# Patient Record
Sex: Female | Born: 1975 | Race: Black or African American | Hispanic: No | Marital: Single | Smoking: Former smoker
Health system: Southern US, Community
[De-identification: ages and names within clinical notes are randomized; demographics above are authoritative.]

## PROBLEM LIST (undated history)

## (undated) DIAGNOSIS — S62609A Fracture of unspecified phalanx of unspecified finger, initial encounter for closed fracture: Secondary | ICD-10-CM

## (undated) HISTORY — DX: Fracture of unspecified phalanx of unspecified finger, initial encounter for closed fracture: S62.609A

---

## 2008-12-02 DIAGNOSIS — S62609A Fracture of unspecified phalanx of unspecified finger, initial encounter for closed fracture: Secondary | ICD-10-CM

## 2008-12-02 HISTORY — DX: Fracture of unspecified phalanx of unspecified finger, initial encounter for closed fracture: S62.609A

## 2017-07-06 ENCOUNTER — Encounter: Payer: Self-pay | Admitting: Emergency Medicine

## 2017-07-06 ENCOUNTER — Emergency Department: Payer: Self-pay

## 2017-07-06 ENCOUNTER — Emergency Department
Admission: EM | Admit: 2017-07-06 | Discharge: 2017-07-06 | Disposition: A | Discharge status: Home / Self Care | Payer: Self-pay | Attending: Emergency Medicine | Admitting: Emergency Medicine

## 2017-07-06 DIAGNOSIS — N8000 Endometriosis of the uterus, unspecified: Secondary | ICD-10-CM

## 2017-07-06 DIAGNOSIS — N946 Dysmenorrhea, unspecified: Secondary | ICD-10-CM | POA: Insufficient documentation

## 2017-07-06 LAB — COMPREHENSIVE METABOLIC PANEL
ALBUMIN: 3.7 g/dL (ref 3.5–5.0)
ALT: 11 U/L — ABNORMAL LOW (ref 14–54)
ANION GAP: 5 (ref 5–15)
AST: 16 U/L (ref 15–41)
Alkaline Phosphatase: 50 U/L (ref 38–126)
BILIRUBIN TOTAL: 0.8 mg/dL (ref 0.3–1.2)
BUN: 11 mg/dL (ref 6–20)
CHLORIDE: 111 mmol/L (ref 101–111)
CO2: 24 mmol/L (ref 22–32)
Calcium: 8.9 mg/dL (ref 8.9–10.3)
Creatinine, Ser: 0.89 mg/dL (ref 0.44–1.00)
GFR calc Af Amer: >60 mL/min (ref >60)
GFR calc non Af Amer: >60 mL/min (ref >60)
GLUCOSE: 87 mg/dL (ref 65–99)
POTASSIUM: 4.1 mmol/L (ref 3.5–5.1)
SODIUM: 140 mmol/L (ref 135–145)
TOTAL PROTEIN: 7 g/dL (ref 6.5–8.1)

## 2017-07-06 LAB — URINALYSIS, COMPLETE (UACMP) WITH MICROSCOPIC
BACTERIA UA: NONE SEEN
BILIRUBIN URINE: NEGATIVE
Glucose, UA: NEGATIVE mg/dL
Ketones, ur: NEGATIVE mg/dL
NITRITE: NEGATIVE
PROTEIN: 100 mg/dL — AB
Specific Gravity, Urine: 1.019 (ref 1.005–1.030)
pH: 6 (ref 5.0–8.0)

## 2017-07-06 LAB — WET PREP, GENITAL
Clue Cells Wet Prep HPF POC: NONE SEEN
SPERM: NONE SEEN
Trich, Wet Prep: NONE SEEN
WBC WET PREP: NONE SEEN
Yeast Wet Prep HPF POC: NONE SEEN

## 2017-07-06 LAB — CBC
HEMATOCRIT: 39.7 % (ref 35.0–47.0)
HEMOGLOBIN: 13.1 g/dL (ref 12.0–16.0)
MCH: 28 pg (ref 26.0–34.0)
MCHC: 33 g/dL (ref 32.0–36.0)
MCV: 84.7 fL (ref 80.0–100.0)
Platelets: 317 10*3/uL (ref 150–440)
RBC: 4.69 MIL/uL (ref 3.80–5.20)
RDW: 13 % (ref 11.5–14.5)
WBC: 5.3 10*3/uL (ref 3.6–11.0)

## 2017-07-06 LAB — CHLAMYDIA/NGC RT PCR (ARMC ONLY)
Chlamydia Tr: NOT DETECTED
N gonorrhoeae: NOT DETECTED

## 2017-07-06 LAB — POCT PREGNANCY, URINE: PREG TEST UR: NEGATIVE

## 2017-07-06 MED ORDER — OXYCODONE-ACETAMINOPHEN 5-325 MG PO TABS
1.0000 | ORAL_TABLET | ORAL | Status: DC | PRN
  Administered 2017-07-06: 1 via ORAL
  Filled 2017-07-06: qty 1

## 2017-07-06 MED ORDER — IBUPROFEN 600 MG PO TABS
600.0000 mg | ORAL_TABLET | Freq: Once | ORAL | Status: AC
  Administered 2017-07-06: 600 mg via ORAL
  Filled 2017-07-06: qty 1

## 2017-07-06 MED ORDER — IBUPROFEN 600 MG PO TABS: 600.0000 mg | ORAL_TABLET | Freq: Four times a day (QID) | ORAL | 0 refills | Status: DC | PRN

## 2017-07-06 NOTE — ED Triage Notes (Signed)
Patient presents to the ED with pelvic pain radiating down into her legs x 2 days.  Patient reports the pain began when her period started.  Patient states she has had pain with the last few periods and she states pain is getting worse with each subsequent period.  Patient denies vomiting and diarrhea.  Patient denies tenderness.

## 2017-07-06 NOTE — Discharge Instructions (Signed)
Please follow up closely with obstetrics and gynecology or your primary doctor.  Return to the emergency room if your bleeding worsens, you become weak and dizzy or lightheaded, you have an episode of passing out, develop severe bleeding such as more than 1 soaked pad per hour for more than 3 straight hours, develop abdominal or pelvic pain, fevers chills or other new concerns arise.   

## 2017-07-06 NOTE — ED Notes (Signed)
Pt to front desk c/o pain asking for pain medication. Given dose of percocet per protocol.

## 2017-07-06 NOTE — ED Provider Notes (Signed)
[facility] Emergency Department Provider Note   ____________________________________________   First MD Initiated Contact with Patient 07/06/17 1107     (approximate)  I have reviewed the triage vital signs and the nursing notes.   HISTORY  Chief Complaint Pelvic Pain    HPI Jo Duncan is a 41 y.o. female 4 she is having pain in her lower pelvis, points suprapubically. She's been having this same pain off and on for about the last 2-3 menstrual cycles. She'll have 2-3 days of pain feels like a cramping unremitting discomfort around the time of the start of her menstrual cycles. Seems to be getting worse however. Denies any abnormal odor, she is sexually active with one partner. She denies any pain in the right lower abdomen. No upper abdominal pain. No fevers or chills. No nausea or vomiting.Moving her bowels normally.   History reviewed. No pertinent past medical history.  There are no active problems to display for this patient.   History reviewed. No pertinent surgical history.  Prior to Admission medications   Medication Sig Start Date End Date Taking? Authorizing Provider  ibuprofen (ADVIL,MOTRIN) 600 MG tablet Take 1 tablet (600 mg total) by mouth every 6 (six) hours as needed. 07/06/17   Sharyn Creamer, MD  takes none  Allergies Patient has no known allergies.  No family history on file.  Social History Social History  Substance Use Topics  . Smoking status: Never Smoker  . Smokeless tobacco: Never Used  . Alcohol use Yes     Comment: occasionally    Review of Systems Constitutional: No fever/chills Eyes: No visual changes. ENT: No sore throat. Cardiovascular: Denies chest pain. Respiratory: Denies shortness of breath. Gastrointestinal: No nausea, no vomiting.  No diarrhea.  No constipation. Genitourinary: Negative for dysuria.does note that she sees blood when she urinates because she is on her menstrual  cycle. Musculoskeletal: Negative for back pain. Skin: Negative for rash. Neurological: Negative for headaches, focal weakness or numbness.    ____________________________________________   PHYSICAL EXAM:  VITAL SIGNS: ED Triage Vitals  Enc Vitals Group     BP 07/06/17 0938 137/88     Pulse Rate 07/06/17 0938 90     Resp 07/06/17 0938 18     Temp 07/06/17 0938 98.5 F (36.9 C)     Temp Source 07/06/17 0938 Oral     SpO2 07/06/17 0938 100 %     Weight 07/06/17 0938 140 lb (63.5 kg)     Height 07/06/17 0938 5' (1.524 m)     Head Circumference --      Peak Flow --      Pain Score 07/06/17 0937 10     Pain Loc --      Pain Edu? --      Excl. in GC? --     Constitutional: Alert and oriented. Well appearing and in no acute distress. Eyes: Conjunctivae are normal. Head: Atraumatic. Nose: No congestion/rhinnorhea. Mouth/Throat: Mucous membranes are moist. Neck: No stridor.   Cardiovascular: Normal rate, regular rhythm. Grossly normal heart sounds.  Good peripheral circulation. Respiratory: Normal respiratory effort.  No retractions. Lungs CTAB. Gastrointestinal: Soft and nontenderforearm mild discomfort to palpation suprapubically. No distention.negative Murphy. No pain at McBurney's point. Gynecologic exam performed with the nurse Rosey Bath. External exam is normal. Internal exam demonstrates no cervical motion tenderness. There is mild amount of blood in the vault consistent with menses. No discharge. Musculoskeletal: No lower extremity tenderness nor edema. Neurologic:  Normal speech and language.  No gross focal neurologic deficits are appreciated.  Skin:  Skin is warm, dry and intact. No rash noted. Psychiatric: Mood and affect are normal. Speech and behavior are normal.  ____________________________________________   LABS (all labs ordered are listed, but only abnormal results are displayed)  Labs Reviewed  COMPREHENSIVE METABOLIC PANEL - Abnormal; Notable for the  following:       Result Value   ALT 11 (*)    All other components within normal limits  URINALYSIS, COMPLETE (UACMP) WITH MICROSCOPIC - Abnormal; Notable for the following:    Color, Urine RED (*)    APPearance CLOUDY (*)    Hgb urine dipstick LARGE (*)    Protein, ur 100 (*)    Leukocytes, UA TRACE (*)    Squamous Epithelial / LPF 6-30 (*)    All other components within normal limits  WET PREP, GENITAL  CHLAMYDIA/NGC RT PCR ([facility] ONLY)  CBC  POC URINE PREG, ED  POCT PREGNANCY, URINE   ____________________________________________  EKG   ____________________________________________  RADIOLOGY  [place] Transvaginal Non-ob  Result Date: 07/06/2017 CLINICAL DATA:  Acute onset lower abdominal and pelvic pain for 1 day. LMP 07/05/2017 EXAM: TRANSABDOMINAL AND TRANSVAGINAL ULTRASOUND OF PELVIS DOPPLER ULTRASOUND OF OVARIES TECHNIQUE: Both transabdominal and transvaginal ultrasound examinations of the pelvis were performed. Transabdominal technique was performed for global imaging of the pelvis including uterus, ovaries, adnexal regions, and pelvic cul-de-sac. It was necessary to proceed with endovaginal exam following the transabdominal exam to visualize the endometrium and ovaries. Color and duplex Doppler ultrasound was utilized to evaluate blood flow to the ovaries. COMPARISON:  None. FINDINGS: Uterus Measurements: 7.5 x 4.5 x 5.8 cm. Heterogeneous myometrial echogenicity, however no distinct fibroids identified. Endometrium Thickness: 4 mm.  No focal abnormality visualized. Right ovary Measurements: 2.7 x 1.2 x 2.4 cm. Small corpus luteum noted. Otherwise normal appearance/no adnexal mass. Left ovary Measurements: 2.5 x 1.1 x 1.8 cm. Normal appearance/no adnexal mass. Pulsed Doppler evaluation of both ovaries demonstrates normal low-resistance arterial and venous waveforms. Other findings No abnormal free fluid. IMPRESSION: Heterogeneous myometrial echogenicity, suspicious for uterine  adenomyosis. No fibroids identified. Normal appearance of both ovaries. No sonographic evidence for ovarian torsion. Electronically Signed   By: Myles RosenthalJohn  Stahl M.D.   On: 07/06/2017 12:56   [place] Pelvis Complete  Result Date: 07/06/2017 CLINICAL DATA:  Acute onset lower abdominal and pelvic pain for 1 day. LMP 07/05/2017 EXAM: TRANSABDOMINAL AND TRANSVAGINAL ULTRASOUND OF PELVIS DOPPLER ULTRASOUND OF OVARIES TECHNIQUE: Both transabdominal and transvaginal ultrasound examinations of the pelvis were performed. Transabdominal technique was performed for global imaging of the pelvis including uterus, ovaries, adnexal regions, and pelvic cul-de-sac. It was necessary to proceed with endovaginal exam following the transabdominal exam to visualize the endometrium and ovaries. Color and duplex Doppler ultrasound was utilized to evaluate blood flow to the ovaries. COMPARISON:  None. FINDINGS: Uterus Measurements: 7.5 x 4.5 x 5.8 cm. Heterogeneous myometrial echogenicity, however no distinct fibroids identified. Endometrium Thickness: 4 mm.  No focal abnormality visualized. Right ovary Measurements: 2.7 x 1.2 x 2.4 cm. Small corpus luteum noted. Otherwise normal appearance/no adnexal mass. Left ovary Measurements: 2.5 x 1.1 x 1.8 cm. Normal appearance/no adnexal mass. Pulsed Doppler evaluation of both ovaries demonstrates normal low-resistance arterial and venous waveforms. Other findings No abnormal free fluid. IMPRESSION: Heterogeneous myometrial echogenicity, suspicious for uterine adenomyosis. No fibroids identified. Normal appearance of both ovaries. No sonographic evidence for ovarian torsion. Electronically Signed   By: Myles RosenthalJohn  Stahl M.D.   On:  07/06/2017 12:56   [place] Art/ven Flow Abd Pelv Doppler  Result Date: 07/06/2017 CLINICAL DATA:  Acute onset lower abdominal and pelvic pain for 1 day. LMP 07/05/2017 EXAM: TRANSABDOMINAL AND TRANSVAGINAL ULTRASOUND OF PELVIS DOPPLER ULTRASOUND OF OVARIES TECHNIQUE: Both transabdominal  and transvaginal ultrasound examinations of the pelvis were performed. Transabdominal technique was performed for global imaging of the pelvis including uterus, ovaries, adnexal regions, and pelvic cul-de-sac. It was necessary to proceed with endovaginal exam following the transabdominal exam to visualize the endometrium and ovaries. Color and duplex Doppler ultrasound was utilized to evaluate blood flow to the ovaries. COMPARISON:  None. FINDINGS: Uterus Measurements: 7.5 x 4.5 x 5.8 cm. Heterogeneous myometrial echogenicity, however no distinct fibroids identified. Endometrium Thickness: 4 mm.  No focal abnormality visualized. Right ovary Measurements: 2.7 x 1.2 x 2.4 cm. Small corpus luteum noted. Otherwise normal appearance/no adnexal mass. Left ovary Measurements: 2.5 x 1.1 x 1.8 cm. Normal appearance/no adnexal mass. Pulsed Doppler evaluation of both ovaries demonstrates normal low-resistance arterial and venous waveforms. Other findings No abnormal free fluid. IMPRESSION: Heterogeneous myometrial echogenicity, suspicious for uterine adenomyosis. No fibroids identified. Normal appearance of both ovaries. No sonographic evidence for ovarian torsion. Electronically Signed   By: Myles RosenthalJohn  Stahl M.D.   On: 07/06/2017 12:56    ____________________________________________   PROCEDURES  Procedure(s) performed: None  Procedures  Critical Care performed: No  ____________________________________________   INITIAL IMPRESSION / ASSESSMENT AND PLAN / ED COURSE  Pertinent labs & imaging results that were available during my care of the patient were reviewed by me and considered in my medical decision making (see chart for details).  Lower abdominal pain. Appears to be focused in most prominent at the time of her menses for the last 2-3 months. Does have tenderness suprapubically. No fever, no leukocytosis, no pain in McBurney's point. Reassuring abdominal exam. Suspect likely gynecologic etiology of her  pain. Reassuring examination, we'll obtain a transvaginal ultrasound. Do not see any indication of an acute abdomen or need to warrant CT imagin     ----------------------------------------- 1:05 PM on 07/06/2017 -----------------------------------------  Patient reports improvement. Resting comfortably. Decreased and only minimal discomfort suprapubically. No pain in the remaining abdomen. Reviewed ultrasound results and recommended follow-up with gynecology. Patient agreeable. Return precautions and treatment recommendations and follow-up discussed with the patient who is agreeable with the plan.  ____________________________________________   FINAL CLINICAL IMPRESSION(S) / ED DIAGNOSES  Final diagnoses:  Painful menstruation  Uterus, adenomyosis      NEW MEDICATIONS STARTED DURING THIS VISIT:  New Prescriptions   IBUPROFEN (ADVIL,MOTRIN) 600 MG TABLET    Take 1 tablet (600 mg total) by mouth every 6 (six) hours as needed.     Note:  This document was prepared using Dragon voice recognition software and may include unintentional dictation errors.     Sharyn CreamerQuale, Darrelle Barrell, MD 07/06/17 [phone]

## 2017-07-11 ENCOUNTER — Encounter: Payer: Self-pay | Admitting: Obstetrics & Gynecology

## 2017-07-11 ENCOUNTER — Ambulatory Visit (INDEPENDENT_AMBULATORY_CARE_PROVIDER_SITE_OTHER): Payer: Self-pay | Admitting: Obstetrics & Gynecology

## 2017-07-11 VITALS — BP 118/76 | Ht 60.0 in | Wt 140.0 lb

## 2017-07-11 DIAGNOSIS — N946 Dysmenorrhea, unspecified: Secondary | ICD-10-CM

## 2017-07-11 DIAGNOSIS — Z87410 Personal history of cervical dysplasia: Secondary | ICD-10-CM | POA: Insufficient documentation

## 2017-07-11 NOTE — Progress Notes (Signed)
Dysmenorrhea/ Premenstrual Syndrome Patient complains of menstrual symptoms. Symptoms began several months ago. Patient describes symptoms of pelvic pain (moderate). Symptoms occur with periods, which are usually 3-5 days apart and quite regular. Patient denies breast tenderness, depression, labile mood and menorrhagia. Evaluation to date includes: pelvic US (Adenomyosis, no fibroids). Treatment to date includes: nothing. The patient is sexually active.  Prior Cryo to cervix for dysplasia. Periods are light but very painful.   PMHx: She  has no past medical history on file. Also,  has no past surgical history on file., family history includes Colon cancer in her maternal grandfather; Diabetes in her father and mother; Hypertension in her father and mother.,  reports that she has never smoked. She has never used smokeless tobacco. She reports that she drinks alcohol. She reports that she uses drugs, including Marijuana.  She has a current medication list which includes the following prescription(s): ibuprofen. Also, is allergic to codeine.  Review of Systems  Constitutional: Negative for chills, fever and malaise/fatigue.  HENT: Negative for congestion, sinus pain and sore throat.   Eyes: Negative for blurred vision and pain.  Respiratory: Negative for cough and wheezing.   Cardiovascular: Negative for chest pain and leg swelling.  Gastrointestinal: Negative for abdominal pain, constipation, diarrhea, heartburn, nausea and vomiting.  Genitourinary: Negative for dysuria, frequency, hematuria and urgency.  Musculoskeletal: Negative for back pain, joint pain, myalgias and neck pain.  Skin: Negative for itching and rash.  Neurological: Negative for dizziness, tremors and weakness.  Endo/Heme/Allergies: Does not bruise/bleed easily.  Psychiatric/Behavioral: Negative for depression. The patient is not nervous/anxious and does not have insomnia.     Objective: BP 118/76   Ht 5' (1.524 m)   Wt  140 lb (63.5 kg)   LMP 07/04/2017 (Approximate)   BMI 27.34 kg/m  Physical Exam  Constitutional: She is oriented to person, place, and time. She appears well-developed and well-nourished. No distress.  Genitourinary: Rectum normal, vagina normal and uterus normal. Pelvic exam was performed with patient supine. There is no rash or lesion on the right labia. There is no rash or lesion on the left labia. Vagina exhibits no lesion. No bleeding in the vagina. Right adnexum does not display mass and does not display tenderness. Left adnexum does not display mass and does not display tenderness. Cervix does not exhibit motion tenderness, lesion, friability or polyp.   Uterus is mobile and midaxial. Uterus is not enlarged or exhibiting a mass.  HENT:  Head: Normocephalic and atraumatic. Head is without laceration.  Right Ear: Hearing normal.  Left Ear: Hearing normal.  Nose: No epistaxis.  No foreign bodies.  Mouth/Throat: Uvula is midline, oropharynx is clear and moist and mucous membranes are normal.  Eyes: Pupils are equal, round, and reactive to light.  Neck: Normal range of motion. Neck supple. No thyromegaly present.  Cardiovascular: Normal rate and regular rhythm.  Exam reveals no gallop and no friction rub.   No murmur heard. Pulmonary/Chest: Effort normal and breath sounds normal. No respiratory distress. She has no wheezes. Right breast exhibits no mass, no skin change and no tenderness. Left breast exhibits no mass, no skin change and no tenderness.  Abdominal: Soft. Bowel sounds are normal. She exhibits no distension. There is no tenderness. There is no rebound.  Musculoskeletal: Normal range of motion.  Neurological: She is alert and oriented to person, place, and time. No cranial nerve deficit.  Skin: Skin is warm and dry.  Psychiatric: She has a normal mood and affect.  Judgment normal.  Vitals reviewed.   Results for orders placed or performed during the hospital encounter of  07/06/17  Chlamydia/NGC rt PCR  Result Value Ref Range   Specimen source GC/Chlam ENDOCERVICAL    Chlamydia Tr NOT DETECTED NOT DETECTED   N gonorrhoeae NOT DETECTED NOT DETECTED  Wet prep, genital  Result Value Ref Range   Yeast Wet Prep HPF POC NONE SEEN NONE SEEN   Trich, Wet Prep NONE SEEN NONE SEEN   Clue Cells Wet Prep HPF POC NONE SEEN NONE SEEN   WBC, Wet Prep HPF POC NONE SEEN NONE SEEN   Sperm NONE SEEN   Comprehensive metabolic panel  Result Value Ref Range   Sodium 140 135 - 145 mmol/L   Potassium 4.1 3.5 - 5.1 mmol/L   Chloride 111 101 - 111 mmol/L   CO2 24 22 - 32 mmol/L   Glucose, Bld 87 65 - 99 mg/dL   BUN 11 6 - 20 mg/dL   Creatinine, Ser 1.61 0.44 - 1.00 mg/dL   Calcium 8.9 8.9 - 09.6 mg/dL   Total Protein 7.0 6.5 - 8.1 g/dL   Albumin 3.7 3.5 - 5.0 g/dL   AST 16 15 - 41 U/L   ALT 11 (L) 14 - 54 U/L   Alkaline Phosphatase 50 38 - 126 U/L   Total Bilirubin 0.8 0.3 - 1.2 mg/dL   GFR calc non Af Amer >60 >60 mL/min   GFR calc Af Amer >60 >60 mL/min   Anion gap 5 5 - 15  CBC  Result Value Ref Range   WBC 5.3 3.6 - 11.0 K/uL   RBC 4.69 3.80 - 5.20 MIL/uL   Hemoglobin 13.1 12.0 - 16.0 g/dL   HCT 04.5 40.9 - 81.1 %   MCV 84.7 80.0 - 100.0 fL   MCH 28.0 26.0 - 34.0 pg   MCHC 33.0 32.0 - 36.0 g/dL   RDW 91.4 78.2 - 95.6 %   Platelets 317 150 - 440 K/uL  Urinalysis, Complete w Microscopic  Result Value Ref Range   Color, Urine RED (A) YELLOW   APPearance CLOUDY (A) CLEAR   Specific Gravity, Urine 1.019 1.005 - 1.030   pH 6.0 5.0 - 8.0   Glucose, UA NEGATIVE NEGATIVE mg/dL   Hgb urine dipstick LARGE (A) NEGATIVE   Bilirubin Urine NEGATIVE NEGATIVE   Ketones, ur NEGATIVE NEGATIVE mg/dL   Protein, ur 213 (A) NEGATIVE mg/dL   Nitrite NEGATIVE NEGATIVE   Leukocytes, UA TRACE (A) NEGATIVE   RBC / HPF TOO NUMEROUS TO COUNT 0 - 5 RBC/hpf   WBC, UA 6-30 0 - 5 WBC/hpf   Bacteria, UA NONE SEEN NONE SEEN   Squamous Epithelial / LPF 6-30 (A) NONE SEEN   Mucous  PRESENT   Pregnancy, urine POC  Result Value Ref Range   Preg Test, Ur NEGATIVE NEGATIVE   EXAM: TRANSABDOMINAL AND TRANSVAGINAL ULTRASOUND OF PELVIS  DOPPLER ULTRASOUND OF OVARIES  TECHNIQUE: Both transabdominal and transvaginal ultrasound examinations of the pelvis were performed. Transabdominal technique was performed for global imaging of the pelvis including uterus, ovaries, adnexal regions, and pelvic cul-de-sac.  It was necessary to proceed with endovaginal exam following the transabdominal exam to visualize the endometrium and ovaries. Color and duplex Doppler ultrasound was utilized to evaluate blood flow to the ovaries.  COMPARISON:  None.  FINDINGS: Uterus  Measurements: 7.5 x 4.5 x 5.8 cm. Heterogeneous myometrial echogenicity, however no distinct fibroids identified.  Endometrium  Thickness: 4 mm.  No focal abnormality visualized.  Right ovary  Measurements: 2.7 x 1.2 x 2.4 cm. Small corpus luteum noted. Otherwise normal appearance/no adnexal mass.  Left ovary  Measurements: 2.5 x 1.1 x 1.8 cm. Normal appearance/no adnexal mass.  Pulsed Doppler evaluation of both ovaries demonstrates normal low-resistance arterial and venous waveforms.  Other findings  No abnormal free fluid.  IMPRESSION: Heterogeneous myometrial echogenicity, suspicious for uterine adenomyosis. No fibroids identified.  Normal appearance of both ovaries.  No sonographic evidence for ovarian torsion.  Review of ULTRASOUND.    I have personally reviewed images and report [facility]    Plan of management to be discussed with patient.  ASSESSMENT/PLAN:    Problem List Items Addressed This Visit      Genitourinary   Dysmenorrhea     Other   History of cervical dysplasia - Primary    PAP needed and done. Options for dysmenorrhea and possible hematocolpos/obstructive uterus and adenomyosis discussed (D&C, hysterectomy).   Consideration for future, cont NSAIDs and exp mgt for now.  Annamarie MajorPaul Tiler Brandis, MD, Merlinda FrederickFACOG [facility], [facility] 07/11/2017  1:42 PM

## 2017-07-14 LAB — PAP IG (IMAGE GUIDED): PAP Smear Comment: 0

## 2017-12-24 ENCOUNTER — Encounter: Payer: Self-pay | Admitting: Physician Assistant

## 2017-12-24 ENCOUNTER — Ambulatory Visit (INDEPENDENT_AMBULATORY_CARE_PROVIDER_SITE_OTHER): Payer: No Typology Code available for payment source | Admitting: Physician Assistant

## 2017-12-24 VITALS — BP 122/68 | HR 68 | Temp 98.4°F | Resp 16 | Ht 60.0 in | Wt 144.0 lb

## 2017-12-24 DIAGNOSIS — Z114 Encounter for screening for human immunodeficiency virus [HIV]: Secondary | ICD-10-CM | POA: Diagnosis not present

## 2017-12-24 DIAGNOSIS — Z0000 Encounter for general adult medical examination without abnormal findings: Secondary | ICD-10-CM

## 2017-12-24 DIAGNOSIS — B373 Candidiasis of vulva and vagina: Secondary | ICD-10-CM

## 2017-12-24 DIAGNOSIS — N898 Other specified noninflammatory disorders of vagina: Secondary | ICD-10-CM | POA: Diagnosis not present

## 2017-12-24 DIAGNOSIS — Z130 Encounter for screening for diseases of the blood and blood-forming organs and certain disorders involving the immune mechanism: Secondary | ICD-10-CM | POA: Diagnosis not present

## 2017-12-24 DIAGNOSIS — Z131 Encounter for screening for diabetes mellitus: Secondary | ICD-10-CM | POA: Diagnosis not present

## 2017-12-24 DIAGNOSIS — Z23 Encounter for immunization: Secondary | ICD-10-CM

## 2017-12-24 DIAGNOSIS — Z13220 Encounter for screening for lipoid disorders: Secondary | ICD-10-CM

## 2017-12-24 DIAGNOSIS — Z1239 Encounter for other screening for malignant neoplasm of breast: Secondary | ICD-10-CM

## 2017-12-24 DIAGNOSIS — Z1231 Encounter for screening mammogram for malignant neoplasm of breast: Secondary | ICD-10-CM | POA: Diagnosis not present

## 2017-12-24 DIAGNOSIS — Z113 Encounter for screening for infections with a predominantly sexual mode of transmission: Secondary | ICD-10-CM

## 2017-12-24 DIAGNOSIS — Z1159 Encounter for screening for other viral diseases: Secondary | ICD-10-CM

## 2017-12-24 DIAGNOSIS — Z1329 Encounter for screening for other suspected endocrine disorder: Secondary | ICD-10-CM

## 2017-12-24 DIAGNOSIS — B3731 Acute candidiasis of vulva and vagina: Secondary | ICD-10-CM

## 2017-12-24 NOTE — Progress Notes (Signed)
Patient: Jo Duncan Female    DOB: 12/20/1975   42 y.o.   MRN: [id number] Visit Date: 12/24/2017  Today's Provider: Trey SailorsAdriana M Pollak, PA-C   Chief Complaint  Patient presents with  . Establish Care   Subjective:    Jo Duncan is a 42 y/o woman presenting today to establish care. She lives in [place]. 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 Two children ages 6327, [id number]; three grandchildren. Female partner for three and half years. Sexually active with her partner.   She reports she wants to be screened for STD's including HIV, syphilis, and Hepatitis C.  Smoked for 3 years in the 90's, 1/2 pack per day Alcohol: mixed drink once per day Drugs: marijuana   Tetanus shot: needed Influenza: declined  She has a history of painful menstrual cycles. She is a patient of Dr. Tiburcio PeaHarris at [facility]. 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 She wants to proceed with hysterectomy,   She also reports that she has been having vaginal discharge. She reports having a history of BV. Would like to be tested for this today.   Vaginal Discharge  The patient's primary symptoms include vaginal discharge. This is a recurrent problem. The problem has been gradually worsening.       Allergies  Allergen Reactions  . Codeine Other (See Comments)     Current Outpatient Medications:  .  ibuprofen (ADVIL,MOTRIN) 600 MG tablet, Take 1 tablet (600 mg total) by mouth every 6 (six) hours as needed., Disp: 30 tablet, Rfl: 0  Review of Systems  Genitourinary: Positive for vaginal discharge.    Social History   Tobacco Use  . Smoking status: Former Smoker    Types: Cigarettes    Last attempt to quit: 05/02/1996    Years since quitting: 21.6  . Smokeless tobacco: Never Used  Substance Use Topics  . Alcohol use: Yes    Comment: occasionally   Objective:   BP 122/68 (BP Location: Right Arm, Patient Position: Sitting, Cuff Size: Large)   Pulse 68   Temp 98.4 F (36.9 C) (Oral)   Resp 16    Ht 5' (1.524 m)   Wt 144 lb (65.3 kg)   LMP 12/15/2017   BMI 28.12 kg/m  Vitals:   12/24/17 1417  BP: 122/68  Pulse: 68  Resp: 16  Temp: 98.4 F (36.9 C)  TempSrc: Oral  Weight: 144 lb (65.3 kg)  Height: 5' (1.524 m)     Physical Exam  Constitutional: She is oriented to person, place, and time. She appears well-developed and well-nourished.  HENT:  Right Ear: External ear normal.  Left Ear: External ear normal.  Mouth/Throat: Oropharynx is clear and moist. No oropharyngeal exudate.  Eyes: Conjunctivae are normal.  Neck: Neck supple.  Cardiovascular: Normal rate and regular rhythm.  Pulmonary/Chest: Effort normal and breath sounds normal. Right breast exhibits no inverted nipple, no mass, no nipple discharge, no skin change and no tenderness. Left breast exhibits no inverted nipple, no mass, no nipple discharge, no skin change and no tenderness. Breasts are symmetrical.  Abdominal: Soft. Bowel sounds are normal.  Genitourinary: There is no rash, tenderness, lesion or injury on the right labia. There is no rash, tenderness, lesion or injury on the left labia. Cervix exhibits no discharge. No erythema or tenderness in the vagina. No foreign body in the vagina. No signs of injury around the vagina. Vaginal discharge found.  Lymphadenopathy:    She has no cervical adenopathy.  Neurological: She  is alert and oriented to person, place, and time.  Skin: Skin is warm and dry.  Psychiatric: She has a normal mood and affect. Her behavior is normal.        Assessment & Plan:     1. Annual physical exam   2. Screening for STDs (sexually transmitted diseases)  - HIV antibody (with reflex) - Hepatitis c antibody (reflex) - RPR  3. Breast cancer screening  - MM Digital Screening; Future  4. Encounter for screening for HIV   5. Encounter for hepatitis C screening test for low risk patient   6. Diabetes mellitus screening  - Comprehensive Metabolic Panel (CMET)  7.  Screening cholesterol level  - Lipid Profile  8. Screening for thyroid disorder - TSH  9. Screening for deficiency anemia  - CBC with Differential  10. Vaginal discharge  - NuSwab Vaginitis Plus (VG+)  11. Need for Tdap vaccination  - Tdap vaccine greater than or equal to 7yo IM  Return in about 1 year (around 12/24/2018), or if symptoms worsen or fail to improve, for CPE.  The entirety of the information documented in the History of Present Illness, Review of Systems and Physical Exam were personally obtained by me. Portions of this information were initially documented by Kavin Leech, CMA and reviewed by me for thoroughness and accuracy.        Trey Sailors, PA-C  [facility]

## 2017-12-27 LAB — NUSWAB VAGINITIS PLUS (VG+)
BVAB 2: HIGH Score — AB
Candida albicans, NAA: POSITIVE — AB
Candida glabrata, NAA: NEGATIVE
Chlamydia trachomatis, NAA: NEGATIVE
Neisseria gonorrhoeae, NAA: NEGATIVE
Trich vag by NAA: NEGATIVE

## 2017-12-29 ENCOUNTER — Telehealth: Payer: Self-pay

## 2017-12-29 MED ORDER — FLUCONAZOLE 150 MG PO TABS: ORAL_TABLET | ORAL | 0 refills | Status: DC

## 2017-12-29 NOTE — Telephone Encounter (Signed)
-----   Message from Trey SailorsAdriana M Pollak, [place]PA-C sent at 12/26/2017  1:40 PM EST ----- Nuswab came back positive for Yeast and "indeterminate" for BV. This means she definitely have yeast, and may or may not have BV depending on her symptoms. Would suggest treating the yeast first and seeing if she feels better, if not, can treat for BV as well. I can send in diflucan two pills or terconazole vaginal cream, let me know which one please.

## 2017-12-29 NOTE — Telephone Encounter (Signed)
Pt advised.   She would like Diflucan sent to [facility] [address]   [address],   -Vernona RiegerLaura

## 2017-12-29 NOTE — Telephone Encounter (Signed)
LMTCB 12/29/2017  Thanks,   -Lalanya Rufener  

## 2017-12-29 NOTE — Telephone Encounter (Signed)
Diflucan 150 mg tablets #2 sent to [facility] [address].

## 2017-12-29 NOTE — Addendum Note (Signed)
Addended by: Trey SailorsPOLLAK, ADRIANA M on: 12/29/2017 03:14 PM   Modules accepted: Orders

## 2018-01-26 ENCOUNTER — Telehealth: Payer: Self-pay

## 2018-01-26 ENCOUNTER — Other Ambulatory Visit: Payer: Self-pay | Admitting: Physician Assistant

## 2018-01-26 DIAGNOSIS — B373 Candidiasis of vulva and vagina: Secondary | ICD-10-CM

## 2018-01-26 DIAGNOSIS — B3731 Acute candidiasis of vulva and vagina: Secondary | ICD-10-CM

## 2018-01-26 NOTE — Telephone Encounter (Signed)
Pt was seen on 12/24/2017 for a Yeast infection.  She reports she took one Diflucan and is still having symptoms.  She would like a refill [facility] to [facility] [address]  She states she only received one pill 12/24/2017.  Thanks,   -Vernona RiegerLaura

## 2019-04-29 ENCOUNTER — Other Ambulatory Visit: Payer: Self-pay | Admitting: Physician Assistant

## 2019-04-29 DIAGNOSIS — B373 Candidiasis of vulva and vagina: Secondary | ICD-10-CM

## 2019-04-29 DIAGNOSIS — B3731 Acute candidiasis of vulva and vagina: Secondary | ICD-10-CM

## 2019-04-29 MED ORDER — FLUCONAZOLE 150 MG PO TABS: ORAL_TABLET | ORAL | 0 refills | Status: DC

## 2019-04-29 NOTE — Telephone Encounter (Signed)
I will send it in once. If she still has issues she will need a follow up office visit.

## 2019-04-29 NOTE — Telephone Encounter (Signed)
Pt says she does not have any insurance to have a visit  CB#  [phone]  Thanks teri

## 2019-04-29 NOTE — Telephone Encounter (Signed)
Schedule e-visit/telephone visit.

## 2019-04-29 NOTE — Telephone Encounter (Signed)
Patient requesting a refill on the Diflucan, reports that she is having another yeast infection.

## 2019-04-29 NOTE — Telephone Encounter (Signed)
Please advise. KW 

## 2019-06-03 ENCOUNTER — Encounter: Payer: Self-pay | Admitting: Emergency Medicine

## 2019-06-03 ENCOUNTER — Emergency Department
Admission: EM | Admit: 2019-06-03 | Discharge: 2019-06-03 | Disposition: A | Discharge status: Home / Self Care | Payer: Self-pay | Attending: Emergency Medicine | Admitting: Emergency Medicine

## 2019-06-03 ENCOUNTER — Other Ambulatory Visit: Payer: Self-pay

## 2019-06-03 ENCOUNTER — Emergency Department: Payer: Self-pay

## 2019-06-03 DIAGNOSIS — R0789 Other chest pain: Secondary | ICD-10-CM | POA: Insufficient documentation

## 2019-06-03 DIAGNOSIS — Z87891 Personal history of nicotine dependence: Secondary | ICD-10-CM | POA: Insufficient documentation

## 2019-06-03 DIAGNOSIS — K5900 Constipation, unspecified: Secondary | ICD-10-CM | POA: Insufficient documentation

## 2019-06-03 MED ORDER — CYCLOBENZAPRINE HCL 10 MG PO TABS: 10.0000 mg | ORAL_TABLET | Freq: Three times a day (TID) | ORAL | 0 refills | Status: DC | PRN

## 2019-06-03 MED ORDER — NAPROXEN 500 MG PO TABS: 500.0000 mg | ORAL_TABLET | Freq: Two times a day (BID) | ORAL | 0 refills | Status: DC

## 2019-06-03 MED ORDER — NAPROXEN 500 MG PO TABS
500.0000 mg | ORAL_TABLET | Freq: Once | ORAL | Status: AC
  Administered 2019-06-03: 500 mg via ORAL
  Filled 2019-06-03: qty 1

## 2019-06-03 NOTE — ED Provider Notes (Signed)
[facility] Emergency Department Provider Note  ____________________________________________  Time seen: Approximately 9:16 AM  I have reviewed the triage vital signs and the nursing notes.   HISTORY  Chief Complaint Chest Pain and Shortness of Breath   HPI Jo Duncan is a 43 y.o. female who presents to the emergency department for treatment and evaluation of right lower rib pain. Symptoms started yesterday. Area hurts worse with deep breath. Three days ago she had a sore throat and had a COVID-19 test, which is still pending. She now denies sore throat. She also denies cough, shortness of breath, fever, nausea, vomiting, or diarrhea. She also states that she is constipated. No alleviating measures attempted.   Past Medical History:  Diagnosis Date  . Finger fracture, right 2010    Patient Active Problem List   Diagnosis Date Noted  . History of cervical dysplasia 07/11/2017  . Dysmenorrhea 07/11/2017    History reviewed. No pertinent surgical history.  Prior to Admission medications   Medication Sig Start Date End Date Taking? Authorizing Provider  cyclobenzaprine (FLEXERIL) 10 MG tablet Take 1 tablet (10 mg total) by mouth 3 (three) times daily as needed. 06/03/19   Brittish Bolinger, Johnette Abraham B, FNP  naproxen (NAPROSYN) 500 MG tablet Take 1 tablet (500 mg total) by mouth 2 (two) times daily with a meal. 06/03/19   Giovanie Lefebre B, FNP    Allergies Codeine  Family History  Problem Relation Age of Onset  . Diabetes Mother   . Hypertension Mother   . Diabetes Father   . Hypertension Father   . Colon cancer Maternal Grandfather   . Diabetes Maternal Grandmother   . Hypertension Maternal Grandmother   . Cancer Neg Hx   . Ovarian cancer Neg Hx     Social History Social History   Tobacco Use  . Smoking status: Former Smoker    Types: Cigarettes    Quit date: 05/02/1996    Years since quitting: 23.1  . Smokeless tobacco: Never Used  Substance Use  Topics  . Alcohol use: Yes    Comment: occasionally  . Drug use: Yes    Types: Marijuana    Review of Systems Constitutional: Negative for fever/chills. Normal appetite. ENT: No sore throat. Cardiovascular: Denies chest pain. Respiratory: No shortness of breath. No for cough. No wheezing.  Gastrointestinal: No nausea,  no vomiting.  no diarrhea.  Musculoskeletal: Negative for body aches. Positive for right lateral chest wall pain. Skin: Negative for rash. Neurological: Negative for headaches ____________________________________________   PHYSICAL EXAM:  VITAL SIGNS: ED Triage Vitals  Enc Vitals Group     BP 06/03/19 0742 129/78     Pulse Rate 06/03/19 0742 73     Resp --      Temp 06/03/19 0742 98.4 F (36.9 C)     Temp Source 06/03/19 0742 Oral     SpO2 06/03/19 0742 98 %     Weight 06/03/19 0742 143 lb (64.9 kg)     Height 06/03/19 0742 5' (1.524 m)     Head Circumference --      Peak Flow --      Pain Score 06/03/19 0736 8     Pain Loc --      Pain Edu? --      Excl. in [place]? --     Constitutional: Alert and oriented. Well appearing and in no acute distress. Eyes: Conjunctivae are normal. Ears: exam deferred. Nose: no sinus congestion noted; no rhinnorhea. Mouth/Throat: Mucous membranes are  moist.  Oropharynx without erythema. Tonsils not visualized. Uvula midline. Neck: No stridor.  Lymphatic: no cervical lymphadenopathy. Cardiovascular: Normal rate, regular rhythm. Good peripheral circulation. Respiratory: Respirations are even and unlabored.  No retractions. Breath sounds are clear. Reproducible right lateral rib pain with deep breath, palpation, and movement. Gastrointestinal: Soft and nontender. Bowel sounds present.  Musculoskeletal: FROM x 4 extremities.  Neurologic:  Normal speech and language. Skin:  Skin is warm, dry and intact. No rash noted. Psychiatric: Mood and affect are normal. Speech and behavior are  normal.  ____________________________________________   LABS (all labs ordered are listed, but only abnormal results are displayed)  Labs Reviewed - No data to display ____________________________________________  EKG  Not indicated. ____________________________________________  RADIOLOGY  Chest x-ray shows no acute cardiopulmonary process. ____________________________________________   PROCEDURES  Procedure(s) performed: None  Critical Care performed: No ____________________________________________   INITIAL IMPRESSION / ASSESSMENT AND PLAN / ED COURSE  43 y.o. female who presents to the emergency department for evaluation of right lateral thoracic pain that started yesterday. She denies shortness of breath. She has not taken anything to help relieve this.   Chest x-ray and exam are reassuring. PERC negative. No lower extremity pain or swelling. Low suspicion for COVID-19 and test from 2 days ago is still pending. Vital signs are normal--no tachycardia, fever, or hypoxia.   Patient will be treated with magnesium citrate for reported constipation, naprosyn, and flexeril. She is to follow up with primary care or return to the ER for symptoms of concern.    Medications  naproxen (NAPROSYN) tablet 500 mg (has no administration in time range)    ED Discharge Orders         Ordered    naproxen (NAPROSYN) 500 MG tablet  2 times daily with meals     06/03/19 0932    cyclobenzaprine (FLEXERIL) 10 MG tablet  3 times daily PRN     06/03/19 0932           Jo Duncan was evaluated in Emergency Department on 06/03/2019 for the symptoms described in the history of present illness. She was evaluated in the context of the global COVID-19 pandemic, which necessitated consideration that the patient might be at risk for infection with the SARS-CoV-2 virus that causes COVID-19. Institutional protocols and algorithms that pertain to the evaluation of patients at risk for  COVID-19 are in a state of rapid change based on information released by regulatory bodies including the CDC and federal and state organizations. These policies and algorithms were followed during the patient's care in the ED.   Pertinent labs & imaging results that were available during my care of the patient were reviewed by me and considered in my medical decision making (see chart for details).    If controlled substance prescribed during this visit, 12 month history viewed on the [facility] prior to issuing an initial prescription for Schedule II or III opiod. ____________________________________________   FINAL CLINICAL IMPRESSION(S) / ED DIAGNOSES  Final diagnoses:  Acute chest wall pain  Constipation, unspecified constipation type    Note:  This document was prepared using Dragon voice recognition software and may include unintentional dictation errors.     Chinita Pesterriplett, Beau Ramsburg B, FNP 06/03/19 [id number]    Jeanmarie PlantMcShane, James A, MD 06/03/19 [id number]

## 2019-06-03 NOTE — Discharge Instructions (Addendum)
Please follow up with your primary care provider if not improving over the next few days.  Rest and apply ice to the area 20 minutes per hour while awake.  Take Magnesium Citrate--you can purchase this at the pharmacy--to help with constipation.  Return to the ER for symptoms that change or worsen if unable to schedule an appointment.

## 2019-06-03 NOTE — ED Triage Notes (Signed)
Says she had had a cough.  Now since yesterday had pain right lower rib area that increases with deep breath.  Tested for covid couple days ago.

## 2019-06-03 NOTE — ED Notes (Addendum)
See triage note states she developed pain to right lateral rib area   States pain increases with inspiration  States she had a sore throat on Monday and went to urgent care  They did a COVID swab but she doesn't have the results  Afebrile on arrival

## 2019-10-04 ENCOUNTER — Other Ambulatory Visit: Payer: Self-pay

## 2019-10-06 ENCOUNTER — Ambulatory Visit: Payer: Self-pay | Attending: Oncology

## 2019-10-06 ENCOUNTER — Ambulatory Visit
Admission: RE | Admit: 2019-10-06 | Discharge: 2019-10-06 | Disposition: A | Discharge status: Home / Self Care | Payer: Self-pay | Source: Ambulatory Visit | Attending: Oncology | Admitting: Oncology

## 2019-10-06 ENCOUNTER — Other Ambulatory Visit: Payer: Self-pay

## 2019-10-06 VITALS — BP 119/79 | HR 78 | Temp 97.2°F | Resp 16 | Ht 64.0 in | Wt 148.1 lb

## 2019-10-06 DIAGNOSIS — Z0000 Encounter for general adult medical examination without abnormal findings: Secondary | ICD-10-CM

## 2019-10-06 NOTE — Progress Notes (Signed)
  Subjective:     Patient ID: Stanton Kidney, female   DOB: 06-11-1976, 43 y.o.   MRN: [id number]  HPI   Review of Systems     Objective:   Physical Exam Chest:     Breasts:        Right: No swelling, bleeding, inverted nipple, mass, nipple discharge, skin change or tenderness.        Left: No swelling, bleeding, inverted nipple, mass, nipple discharge, skin change or tenderness.        Assessment:     43 year old patient presents for [facility] clinic visit.  Patient recently moved [place] to [place] to son, and grandchildren. Patient screened, and meets BCCCP eligibility.  Patient does not have insurance, Medicare or Medicaid. Instructed patient on breast self awareness using teach back method. Clinical breast exam unremarkable.  No mass or lump palpated.   Risk Assessment    No risk assessment data for the current encounter   Risk Scores      10/04/2019   Last edited by: Orson Slick, CMA   5-year risk: 0.9 %   Lifetime risk: 9.3 %            Plan:     Sent for bilateral screening mammogram.

## 2019-11-08 ENCOUNTER — Encounter: Payer: Self-pay | Admitting: *Deleted

## 2019-12-10 NOTE — Progress Notes (Signed)
Letter mailed from [facility] to notify of normal mammogram results.  Patient to return in one year for annual screening.  Copy to [facility]. 

## 2021-01-02 NOTE — Progress Notes (Signed)
Due to Covid 19 pandemic a Televisit was used to enroll patient into our BCCCP program.  2 patient identifiers used to confirm I was speaking to the correct patient.   Verbal consent obtained.  Health history obtained.  Patient denies any breast problems.  States she just had a pap smear 2 weeks ago at [facility].  She is to report to the [facility] on 01/04/21 @ 8:30 for her mammogram.  Will follow up per BCCCP protocol.

## 2021-01-03 ENCOUNTER — Other Ambulatory Visit: Payer: Self-pay

## 2021-01-03 ENCOUNTER — Ambulatory Visit: Payer: Self-pay | Attending: Oncology | Admitting: *Deleted

## 2021-01-03 ENCOUNTER — Other Ambulatory Visit: Payer: Self-pay | Admitting: *Deleted

## 2021-01-03 ENCOUNTER — Encounter: Payer: Self-pay | Admitting: *Deleted

## 2021-01-03 ENCOUNTER — Ambulatory Visit
Admission: RE | Admit: 2021-01-03 | Discharge: 2021-01-03 | Disposition: A | Discharge status: Home / Self Care | Payer: Self-pay | Source: Ambulatory Visit | Attending: Oncology | Admitting: Oncology

## 2021-01-03 DIAGNOSIS — Z0000 Encounter for general adult medical examination without abnormal findings: Secondary | ICD-10-CM | POA: Insufficient documentation

## 2021-01-03 DIAGNOSIS — R920 Mammographic microcalcification found on diagnostic imaging of breast: Secondary | ICD-10-CM

## 2021-01-15 ENCOUNTER — Other Ambulatory Visit: Payer: Self-pay

## 2021-01-15 ENCOUNTER — Ambulatory Visit
Admission: RE | Admit: 2021-01-15 | Discharge: 2021-01-15 | Disposition: A | Discharge status: Home / Self Care | Payer: Self-pay | Source: Ambulatory Visit | Attending: Oncology | Admitting: Oncology

## 2021-01-15 DIAGNOSIS — R920 Mammographic microcalcification found on diagnostic imaging of breast: Secondary | ICD-10-CM | POA: Insufficient documentation

## 2021-01-16 ENCOUNTER — Other Ambulatory Visit: Payer: Self-pay

## 2021-01-16 DIAGNOSIS — R920 Mammographic microcalcification found on diagnostic imaging of breast: Secondary | ICD-10-CM

## 2021-01-17 ENCOUNTER — Telehealth: Payer: Self-pay | Admitting: *Deleted

## 2021-01-17 NOTE — Telephone Encounter (Signed)
VM TO PT TO [facility]

## 2021-01-25 ENCOUNTER — Ambulatory Visit
Admission: RE | Admit: 2021-01-25 | Discharge: 2021-01-25 | Disposition: A | Discharge status: Home / Self Care | Payer: Self-pay | Source: Ambulatory Visit | Attending: Oncology | Admitting: Oncology

## 2021-01-25 ENCOUNTER — Other Ambulatory Visit: Payer: Self-pay

## 2021-01-25 DIAGNOSIS — R920 Mammographic microcalcification found on diagnostic imaging of breast: Secondary | ICD-10-CM | POA: Insufficient documentation

## 2021-01-25 HISTORY — PX: BREAST BIOPSY: SHX20

## 2021-01-26 LAB — SURGICAL PATHOLOGY

## 2021-01-30 ENCOUNTER — Encounter: Payer: Self-pay | Admitting: *Deleted

## 2021-01-30 NOTE — Progress Notes (Signed)
Patient notified by Randa Lynn, RN of her benign breast biopsy.  She is to follow up in 1 year with her next mammogram.

## 2021-05-25 ENCOUNTER — Other Ambulatory Visit: Payer: Self-pay

## 2021-05-25 ENCOUNTER — Emergency Department
Admission: EM | Admit: 2021-05-25 | Discharge: 2021-05-25 | Disposition: A | Discharge status: Home / Self Care | Payer: 59 | Attending: Emergency Medicine | Admitting: Emergency Medicine

## 2021-05-25 ENCOUNTER — Emergency Department: Payer: 59

## 2021-05-25 DIAGNOSIS — Z87891 Personal history of nicotine dependence: Secondary | ICD-10-CM | POA: Diagnosis not present

## 2021-05-25 DIAGNOSIS — Z20822 Contact with and (suspected) exposure to covid-19: Secondary | ICD-10-CM | POA: Diagnosis not present

## 2021-05-25 DIAGNOSIS — R42 Dizziness and giddiness: Secondary | ICD-10-CM | POA: Diagnosis not present

## 2021-05-25 LAB — CBC
HCT: 38.6 % (ref 36.0–46.0)
Hemoglobin: 12.4 g/dL (ref 12.0–15.0)
MCH: 27.4 pg (ref 26.0–34.0)
MCHC: 32.1 g/dL (ref 30.0–36.0)
MCV: 85.2 fL (ref 80.0–100.0)
Platelets: 335 10*3/uL (ref 150–400)
RBC: 4.53 MIL/uL (ref 3.87–5.11)
RDW: 13.4 % (ref 11.5–15.5)
WBC: 6.3 10*3/uL (ref 4.0–10.5)
nRBC: 0 % (ref 0.0–0.2)

## 2021-05-25 LAB — URINALYSIS, COMPLETE (UACMP) WITH MICROSCOPIC
Bacteria, UA: NONE SEEN
Bilirubin Urine: NEGATIVE
Glucose, UA: NEGATIVE mg/dL
Hgb urine dipstick: NEGATIVE
Ketones, ur: NEGATIVE mg/dL
Leukocytes,Ua: NEGATIVE
Nitrite: NEGATIVE
Protein, ur: NEGATIVE mg/dL
Specific Gravity, Urine: 1.015 (ref 1.005–1.030)
pH: 7 (ref 5.0–8.0)

## 2021-05-25 LAB — TROPONIN I (HIGH SENSITIVITY): Troponin I (High Sensitivity): 5 ng/L (ref <18)

## 2021-05-25 LAB — BASIC METABOLIC PANEL
Anion gap: 7 (ref 5–15)
BUN: 12 mg/dL (ref 6–20)
CO2: 27 mmol/L (ref 22–32)
Calcium: 9.1 mg/dL (ref 8.9–10.3)
Chloride: 105 mmol/L (ref 98–111)
Creatinine, Ser: 0.81 mg/dL (ref 0.44–1.00)
GFR, Estimated: >60 mL/min (ref >60)
Glucose, Bld: 89 mg/dL (ref 70–99)
Potassium: 3.5 mmol/L (ref 3.5–5.1)
Sodium: 139 mmol/L (ref 135–145)

## 2021-05-25 LAB — RESP PANEL BY RT-PCR (FLU A&B, COVID) ARPGX2
Influenza A by PCR: NEGATIVE
Influenza B by PCR: NEGATIVE
SARS Coronavirus 2 by RT PCR: NEGATIVE

## 2021-05-25 LAB — PREGNANCY, URINE: Preg Test, Ur: NEGATIVE

## 2021-05-25 MED ORDER — SODIUM CHLORIDE 0.9 % IV BOLUS
1000.0000 mL | Freq: Once | INTRAVENOUS | Status: AC
  Administered 2021-05-25: 1000 mL via INTRAVENOUS

## 2021-05-25 MED ORDER — MECLIZINE HCL 25 MG PO TABS: 25.0000 mg | ORAL_TABLET | Freq: Three times a day (TID) | ORAL | 0 refills | Status: DC | PRN

## 2021-05-25 MED ORDER — MECLIZINE HCL 25 MG PO TABS
25.0000 mg | ORAL_TABLET | Freq: Once | ORAL | Status: AC
  Administered 2021-05-25: 25 mg via ORAL
  Filled 2021-05-25: qty 1

## 2021-05-25 NOTE — ED Triage Notes (Signed)
Pt here with dizziness this AM. Pt states that she fell as though she was about to faint. Pt states that she is feeling "woozy but denies a headache.

## 2021-05-25 NOTE — ED Provider Notes (Signed)
[facility] Emergency Department Provider Note  ____________________________________________   Event Date/Time   First MD Initiated Contact with Patient 05/25/21 [phone]     (approximate)  I have reviewed the triage vital signs and the nursing notes.   HISTORY  Chief Complaint Dizziness    HPI Jo Duncan is a 45 y.o. female who is otherwise healthy not on any daily medication who comes in with dizziness.  Patient states that she started feeling a little lightheaded yesterday.  However today it got worse.  She states it is worse with head movements and movement but also has a little bit just sitting at rest.  States it feels like she is woozy and like she may pass out but has not had any loss of consciousness, did not hit her head.  Denies any headaches, neck trauma, chiropractor use of her neck, cough, congestion, fevers, shortness of breath, chest pain.  She states that this happened 1 time previously and got better after fluids but this time is been going on for a longer period of time which is why she came into the emergency room.  She states that she did not eat breakfast this morning but she states that she normally does not eat breakfast.  She states that the symptoms started yesterday even though she had eaten yesterday.          Past Medical History:  Diagnosis Date   Finger fracture, right 2010    Patient Active Problem List   Diagnosis Date Noted   History of cervical dysplasia 07/11/2017   Dysmenorrhea 07/11/2017    Past Surgical History:  Procedure Laterality Date   BREAST BIOPSY Left 01/25/2021   stereo bx, ribbon clip, path pending     Prior to Admission medications   Medication Sig Start Date End Date Taking? Authorizing Provider  cyclobenzaprine (FLEXERIL) 10 MG tablet Take 1 tablet (10 mg total) by mouth 3 (three) times daily as needed. 06/03/19   Triplett, Kasandra Knudsen, FNP  naproxen (NAPROSYN) 500 MG tablet Take 1 tablet (500 mg  total) by mouth 2 (two) times daily with a meal. 06/03/19   Triplett, Cari B, FNP    Allergies Codeine  Family History  Problem Relation Age of Onset   Diabetes Mother    Hypertension Mother    Diabetes Father    Hypertension Father    Colon cancer Maternal Grandfather    Diabetes Maternal Grandmother    Hypertension Maternal Grandmother    Cancer Neg Hx    Ovarian cancer Neg Hx     Social History Social History   Tobacco Use   Smoking status: Former    Pack years: 0.00    Types: Cigarettes    Quit date: 05/02/1996    Years since quitting: 25.0   Smokeless tobacco: Never  Vaping Use   Vaping Use: Never used  Substance Use Topics   Alcohol use: Yes    Comment: occasionally   Drug use: Yes    Types: Marijuana      Review of Systems Constitutional: No fever/chills Eyes: No visual changes. ENT: No sore throat.  Vertigo Cardiovascular: Denies chest pain. Respiratory: Denies shortness of breath. Gastrointestinal: No abdominal pain.  No nausea, no vomiting.  No diarrhea.  No constipation. Genitourinary: Negative for dysuria. Musculoskeletal: Negative for back pain. Skin: Negative for rash. Neurological: Negative for headaches, focal weakness or numbness. All other ROS negative ____________________________________________   PHYSICAL EXAM:  VITAL SIGNS: ED Triage Vitals [05/25/21 0834]  Enc Vitals Group     BP 132/78     Pulse Rate 80     Resp 17     Temp 98 F (36.7 C)     Temp Source Oral     SpO2 99 %     Weight 176 lb (79.8 kg)     Height 5' (1.524 m)     Head Circumference      Peak Flow      Pain Score 0     Pain Loc      Pain Edu?      Excl. in GC?     Constitutional: Alert and oriented. Well appearing and in no acute distress. Eyes: Conjunctivae are normal. EOMI. Head: Atraumatic.  TMs are clear bilaterally Nose: No congestion/rhinnorhea. Mouth/Throat: Mucous membranes are moist.   Neck: No stridor. Trachea Midline. FROM Cardiovascular:  Normal rate, regular rhythm. Grossly normal heart sounds.  Good peripheral circulation. Respiratory: Normal respiratory effort.  No retractions. Lungs CTAB. Gastrointestinal: Soft and nontender. No distention. No abdominal bruits.  Musculoskeletal: No lower extremity tenderness nor edema.  No joint effusions. Neurologic: Cranial nerves II to XII are intact.  Equal strength in arms and legs.  Finger-to-nose intact. Skin:  Skin is warm, dry and intact. No rash noted. Psychiatric: Mood and affect are normal. Speech and behavior are normal. GU: Deferred   ____________________________________________   LABS (all labs ordered are listed, but only abnormal results are displayed)  Labs Reviewed  RESP PANEL BY RT-PCR (FLU A&B, COVID) ARPGX2  BASIC METABOLIC PANEL  CBC  URINALYSIS, COMPLETE (UACMP) WITH MICROSCOPIC  CBG MONITORING, ED  POC URINE PREG, ED  TROPONIN I (HIGH SENSITIVITY)   ____________________________________________   ED ECG REPORT I, Concha Se, the attending physician, personally viewed and interpreted this ECG.  Normal sinus rate 67, no ST elevation, no T wave inversions, normal intervals ____________________________________________  RADIOLOGY   Official radiology report(s): MR BRAIN WO CONTRAST  Result Date: 05/25/2021 CLINICAL DATA:  Dizziness. EXAM: MRI HEAD WITHOUT CONTRAST TECHNIQUE: Multiplanar, multiecho pulse sequences of the brain and surrounding structures were obtained without intravenous contrast. COMPARISON:  None. FINDINGS: Brain: There is no evidence of an acute infarct, intracranial hemorrhage, mass, midline shift, or extra-axial fluid collection. There is mild cerebellar atrophy which is abnormal for age. Supratentorial brain volume is normal. The brain is normal in signal. Vascular: Major intracranial vascular flow voids are preserved. Skull and upper cervical spine: Unremarkable bone marrow signal. Sinuses/Orbits: Clear paranasal sinuses. Trace left  mastoid fluid. Unremarkable orbits. Other: None. IMPRESSION: 1. No acute intracranial abnormality. 2. Mild cerebellar atrophy. Electronically Signed   By: Sebastian Ache M.D.   On: 05/25/2021 12:59    ____________________________________________   PROCEDURES  Procedure(s) performed (including Critical Care):  .1-3 Lead EKG Interpretation  Date/Time: 05/25/2021 12:47 PM Performed by: Concha Se, MD Authorized by: Concha Se, MD     Interpretation: normal     ECG rate:  60s   ECG rate assessment: normal     Rhythm: sinus rhythm     Ectopy: none     Conduction: normal     ____________________________________________   INITIAL IMPRESSION / ASSESSMENT AND PLAN / ED COURSE  Jo Duncan was evaluated in Emergency Department on 05/25/2021 for the symptoms described in the history of present illness. She was evaluated in the context of the global COVID-19 pandemic, which necessitated consideration that the patient might be at risk for infection with the SARS-CoV-2 virus that causes  COVID-19. Institutional protocols and algorithms that pertain to the evaluation of patients at risk for COVID-19 are in a state of rapid change based on information released by regulatory bodies including the CDC and federal and state organizations. These policies and algorithms were followed during the patient's care in the ED.     Patient is a well-appearing 45 year old who comes in with lightheadedness and feeling like she is going to pass out.  I suspect this more likely related to some dehydration versus vertigo we will give some meclizine and IV fluids.  Labs are ordered to evaluate for Electra imbalance, AKI, UTI.  Labs are also ordered to evaluate for pregnancy.  At this time her neuro exam is intact and I have lower suspicion for stroke but will continue to closely monitor.  Denies any trauma to her neck to suggest dissection.  We will keep patient the cardiac monitor in case this could be related  to her heart  Patient states that she is doing better with the medications but still having symptoms even just sitting at rest without any head movement.  We discussed that this still most likely vertigo given no other risk factors.  She is able to ambulate without significant ataxia however patient continues to report pretty significant symptoms even without head movements therefore we discussed different options and will proceed with MRI just to rule out a stroke   MRI is negative.  Patient is able to ambulate.  We will send patient home with meclizine and continued hydration.  She will follow-up with ENT.  Suspect this is most likely vertigo  I discussed the provisional nature of ED diagnosis, the treatment so far, the ongoing plan of care, follow up appointments and return precautions with the patient and any family or support people present. They expressed understanding and agreed with the plan, discharged home.        ____________________________________________   FINAL CLINICAL IMPRESSION(S) / ED DIAGNOSES   Final diagnoses:  Vertigo      MEDICATIONS GIVEN DURING THIS VISIT:  Medications  sodium chloride 0.9 % bolus 1,000 mL (1,000 mLs Intravenous Bolus 05/25/21 1007)  meclizine (ANTIVERT) tablet 25 mg (25 mg Oral Given 05/25/21 8099)     ED Discharge Orders          Ordered    meclizine (ANTIVERT) 25 MG tablet  3 times daily PRN        05/25/21 1312             Note:  This document was prepared using Dragon voice recognition software and may include unintentional dictation errors.    Concha Se, MD 05/25/21 [phone]

## 2021-05-25 NOTE — ED Notes (Signed)
Pt leaving for MRI.  

## 2021-05-25 NOTE — ED Notes (Signed)
Pt on her personal phone; visitor remains at bedside. Pt given this RN's ascom to answer screening questions for MRI.

## 2021-05-25 NOTE — ED Notes (Signed)
Maintenance staff at bedside.

## 2022-04-27 ENCOUNTER — Encounter: Payer: Self-pay | Admitting: Emergency Medicine

## 2022-04-27 ENCOUNTER — Emergency Department
Admission: EM | Admit: 2022-04-27 | Discharge: 2022-04-27 | Disposition: A | Discharge status: Home / Self Care | Payer: 59 | Attending: Emergency Medicine | Admitting: Emergency Medicine

## 2022-04-27 ENCOUNTER — Other Ambulatory Visit: Payer: Self-pay

## 2022-04-27 DIAGNOSIS — Z87891 Personal history of nicotine dependence: Secondary | ICD-10-CM | POA: Diagnosis not present

## 2022-04-27 DIAGNOSIS — K0889 Other specified disorders of teeth and supporting structures: Secondary | ICD-10-CM | POA: Diagnosis present

## 2022-04-27 MED ORDER — CLINDAMYCIN HCL 300 MG PO CAPS: 300.0000 mg | ORAL_CAPSULE | Freq: Three times a day (TID) | ORAL | 0 refills | Status: AC

## 2022-04-27 MED ORDER — TRAMADOL HCL 50 MG PO TABS: 50.0000 mg | ORAL_TABLET | Freq: Four times a day (QID) | ORAL | 0 refills | Status: AC | PRN

## 2022-04-27 NOTE — ED Notes (Signed)
Signature pad in room not working, pt verbalized understanding of d/c

## 2022-04-27 NOTE — ED Provider Notes (Signed)
[facility] Provider Note    Event Date/Time   First MD Initiated Contact with Patient 04/27/22 [phone]     (approximate)   History   Dental Pain   HPI  Jo Duncan is a 46 y.o. female   presents to the ED with complaint of right upper tooth pain.  Patient was seen approximately 1 month ago and placed on amoxicillin which helped until she ran out.  Patient states she has an appointment with a dentist on Thursday.  Patient denies any health problems and is a former smoker.  She rates her pain as 10/10.      Physical Exam   Triage Vital Signs: ED Triage Vitals  Enc Vitals Group     BP 04/27/22 0836 (!) 149/89     Pulse Rate 04/27/22 0836 77     Resp 04/27/22 0836 18     Temp 04/27/22 0836 98.2 F (36.8 C)     Temp Source 04/27/22 0836 Oral     SpO2 04/27/22 0836 98 %     Weight 04/27/22 0823 170 lb (77.1 kg)     Height 04/27/22 0823 5' (1.524 m)     Head Circumference --      Peak Flow --      Pain Score 04/27/22 0823 10     Pain Loc --      Pain Edu? --      Excl. in GC? --     Most recent vital signs: Vitals:   04/27/22 0836  BP: (!) 149/89  Pulse: 77  Resp: 18  Temp: 98.2 F (36.8 C)  SpO2: 98%     General: Awake, no distress.  CV:  Good peripheral perfusion.  Resp:  Normal effort.  Abd:  No distention.  Other:  On exam there is moderate tenderness on palpation of the right upper posterior molars with gum edema present.  No abscess formation is noted.  Drainage present.  No cervical lymphadenopathy is appreciated.   ED Results / Procedures / Treatments   Labs (all labs ordered are listed, but only abnormal results are displayed) Labs Reviewed - No data to display    PROCEDURES:  Critical Care performed:   Procedures   MEDICATIONS ORDERED IN ED: Medications - No data to display   IMPRESSION / MDM / ASSESSMENT AND PLAN / ED COURSE  I reviewed the triage vital signs and the nursing notes.   Differential  diagnosis includes, but is not limited to, dental pain, dental abscess, gingivitis.  46 year old female presents to the ED with complaint of dental pain and 1 month ago was treated with amoxicillin.  Patient denies any fever.  On exam there is moderate tenderness on palpation of the right upper posterior molars with gum edema present.  No abscess formation is noted.  Drainage present.  Patient was started on clindamycin 300 mg 3 times daily for 7 days and tramadol which she has taken in the past without any difficulties and prefers it over codeine, hydrocodone and oxycodone.  She is strongly encouraged to keep her appointment on Thursday with a dentist.    FINAL CLINICAL IMPRESSION(S) / ED DIAGNOSES   Final diagnoses:  Pain, dental     Rx / DC Orders   ED Discharge Orders          Ordered    clindamycin (CLEOCIN) 300 MG capsule  3 times daily        04/27/22 0854    traMADol (ULTRAM) 50  MG tablet  Every 6 hours PRN        04/27/22 0854             Note:  This document was prepared using Dragon voice recognition software and may include unintentional dictation errors.   Tommi Rumps, PA-C 04/27/22 8850    Arnaldo Natal, MD 04/27/22 [phone]

## 2022-04-27 NOTE — Discharge Instructions (Signed)
Keep your appointment with your dentist on Thursday.  Take antibiotics for the next 7 days and also the tramadol is only every 6 hours if needed for pain.

## 2022-04-27 NOTE — ED Triage Notes (Signed)
Pt via POV from home. Pt c/o dental pain states from her R upper wisdom tooth, has a follow up on Thursday. Pt is A&Ox4 and NAD.

## 2022-12-12 DIAGNOSIS — Z20822 Contact with and (suspected) exposure to covid-19: Secondary | ICD-10-CM | POA: Diagnosis not present

## 2022-12-12 DIAGNOSIS — J00 Acute nasopharyngitis [common cold]: Secondary | ICD-10-CM | POA: Diagnosis not present

## 2022-12-12 DIAGNOSIS — R059 Cough, unspecified: Secondary | ICD-10-CM | POA: Diagnosis not present

## 2023-01-17 DIAGNOSIS — Z0000 Encounter for general adult medical examination without abnormal findings: Secondary | ICD-10-CM | POA: Diagnosis not present

## 2023-01-17 DIAGNOSIS — Z1231 Encounter for screening mammogram for malignant neoplasm of breast: Secondary | ICD-10-CM | POA: Diagnosis not present

## 2023-01-17 DIAGNOSIS — Z7689 Persons encountering health services in other specified circumstances: Secondary | ICD-10-CM | POA: Diagnosis not present

## 2023-01-17 DIAGNOSIS — Z87410 Personal history of cervical dysplasia: Secondary | ICD-10-CM | POA: Diagnosis not present

## 2023-01-17 DIAGNOSIS — Z1211 Encounter for screening for malignant neoplasm of colon: Secondary | ICD-10-CM | POA: Diagnosis not present

## 2023-01-17 DIAGNOSIS — N898 Other specified noninflammatory disorders of vagina: Secondary | ICD-10-CM | POA: Diagnosis not present

## 2023-01-17 DIAGNOSIS — Z01419 Encounter for gynecological examination (general) (routine) without abnormal findings: Secondary | ICD-10-CM | POA: Diagnosis not present

## 2023-01-20 ENCOUNTER — Other Ambulatory Visit: Payer: Self-pay | Admitting: Family Medicine

## 2023-01-20 DIAGNOSIS — Z1231 Encounter for screening mammogram for malignant neoplasm of breast: Secondary | ICD-10-CM

## 2023-02-18 IMAGING — MG MM BREAST BX W LOC DEV 1ST LESION IMAGE BX SPEC STEREO GUIDE*L*
8 of 13 series · 8 of 21 positions shown · non-contrast
Comparison: Previous exams.
COMPARISON: Previous exams.

Addendum:
CLINICAL DATA: Left breast upper outer quadrant calcifications.

EXAM:
LEFT BREAST STEREOTACTIC CORE NEEDLE BIOPSY

[L (1 of 6)]
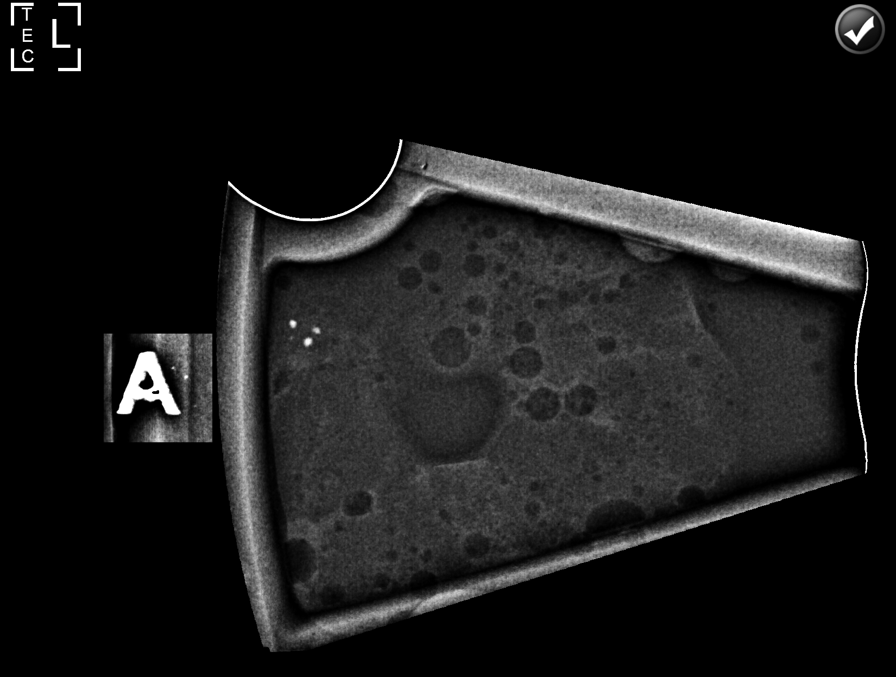

[L (2 of 6)]
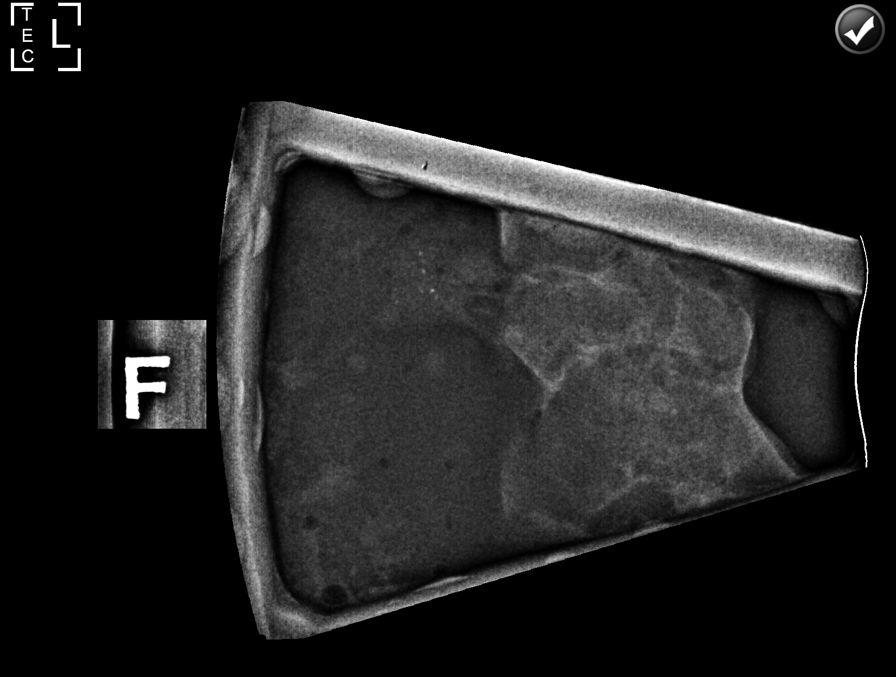

[L (3 of 6)]
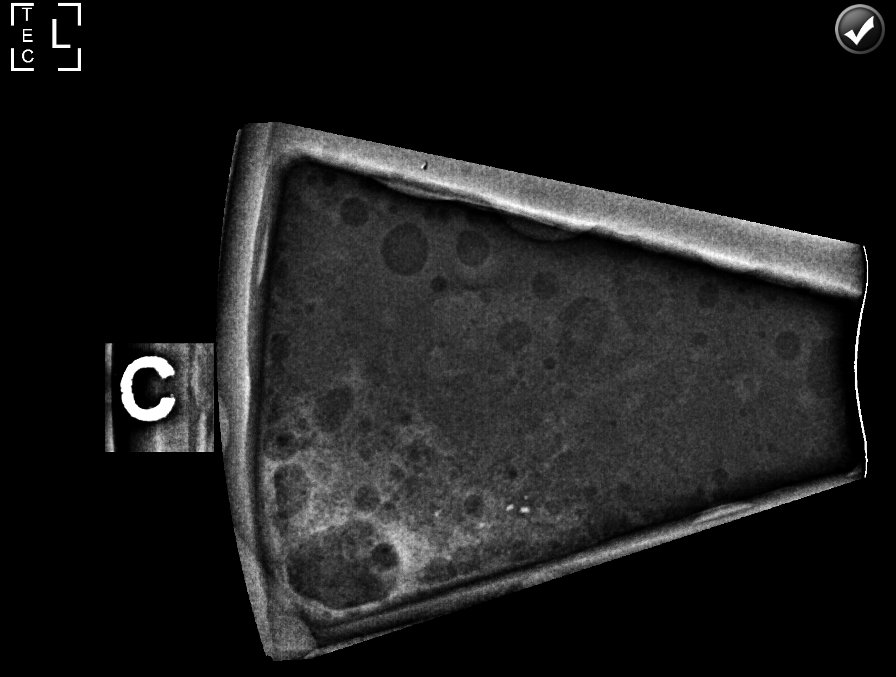

[L (4 of 6)]
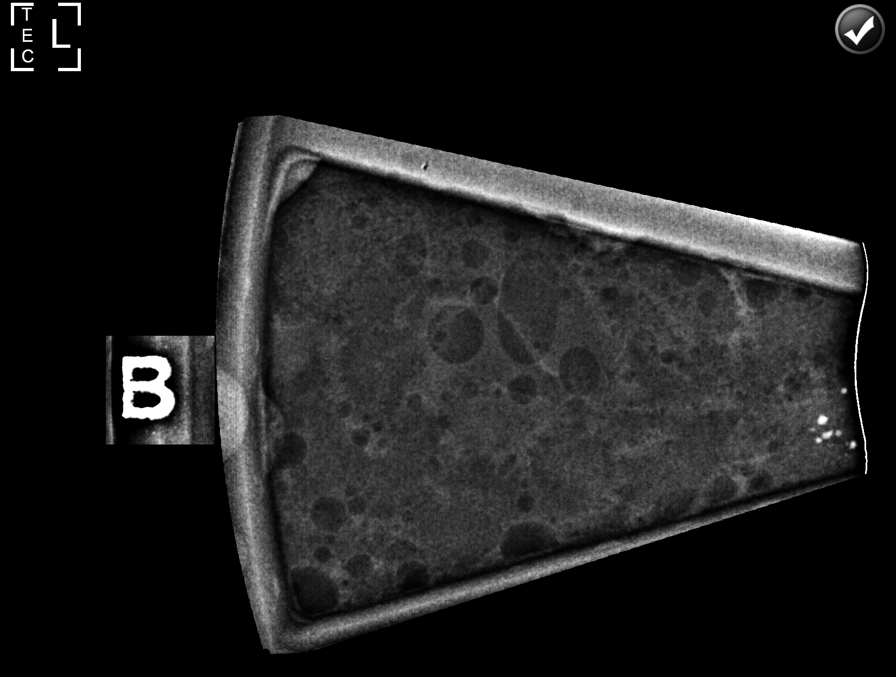

[L (5 of 6)]
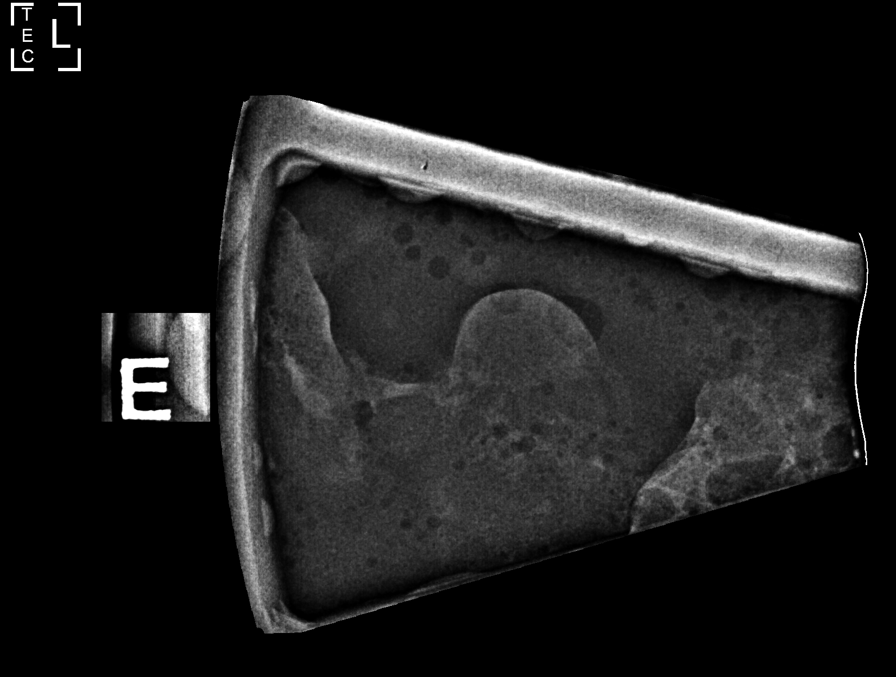

[L (6 of 6)]
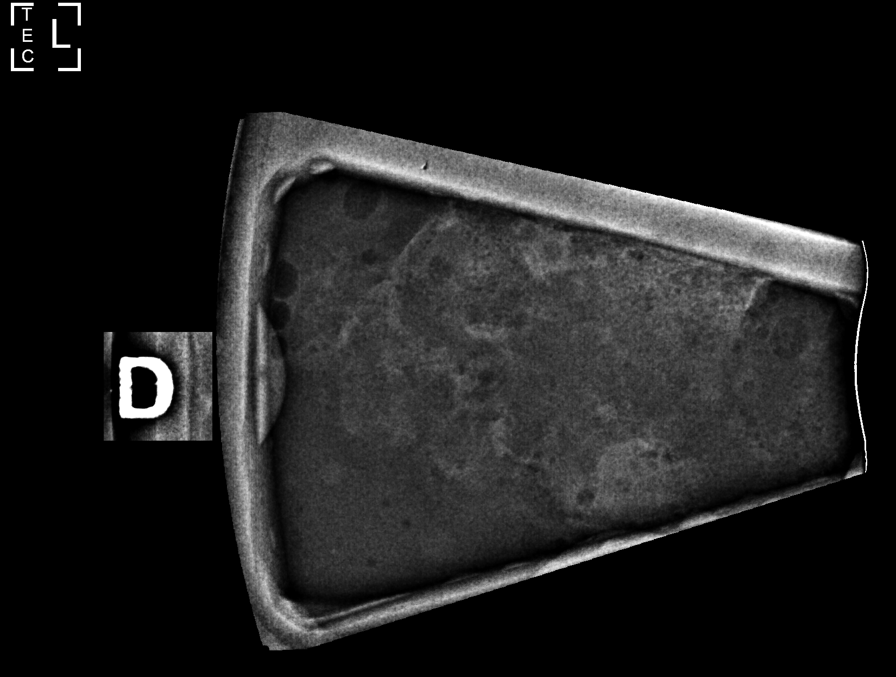

[L CC (1 of 2)]
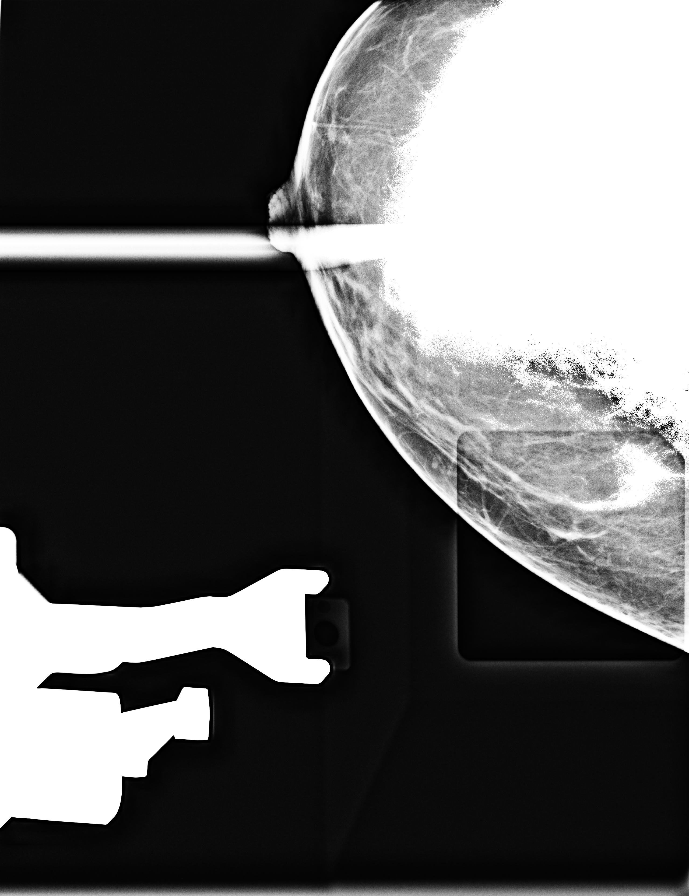

[L CC (2 of 2)]
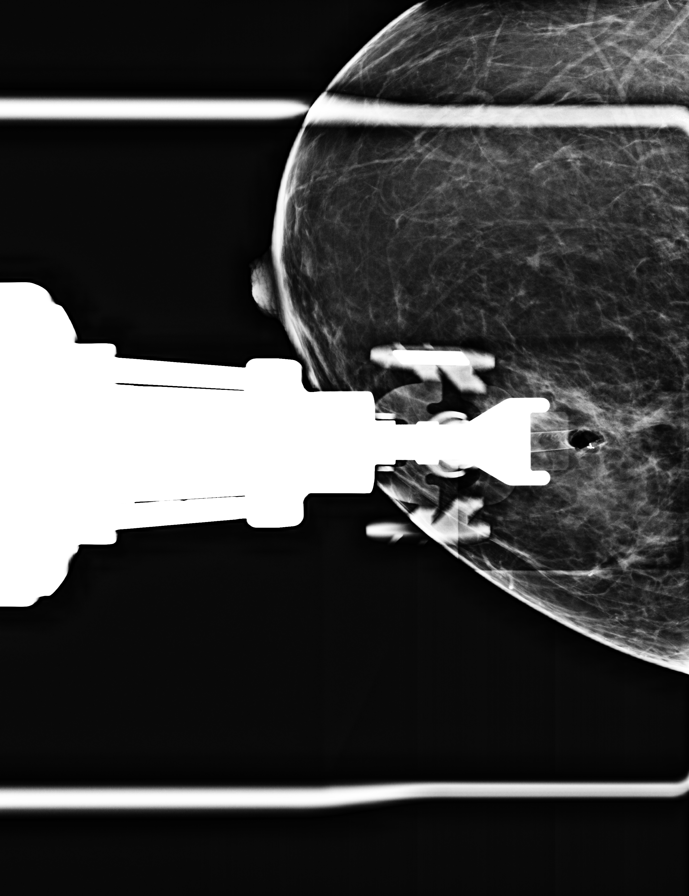

[8 of 21 positions shown; findings below may reference images not displayed]



Using sterile technique and 1% Lidocaine as local anesthetic, under
stereotactic guidance, a 9 gauge vacuum assisted device was used to
perform core needle biopsy of calcifications in the upper-outer
quadrant of the left breast using a superior approach. Specimen
radiograph was performed showing presence of calcifications.
Specimens with calcifications are identified for pathology.

Lesion quadrant: Upper outer quadrant

At the conclusion of the procedure, ribbon tissue marker clip was
deployed into the biopsy cavity. Follow-up 2-view mammogram was
performed and dictated separately.
IMPRESSION: Stereotactic-guided biopsy of left breast. No apparent
complications.

ADDENDUM:
PATHOLOGY revealed: A. BREAST, LEFT UPPER OUTER QUADRANT;
STEREOTACTIC CORE NEEDLE BIOPSY: - BENIGN MAMMARY PARENCHYMA WITH
LACTATIONAL CHANGES, SUGGESTIVE OF LACTATING ADENOMA, WITH
ASSOCIATED CALCIFICATIONS. - NEGATIVE FOR ATYPICAL PROLIFERATIVE
BREAST DISEASE.

Pathology results are CONCORDANT with imaging findings, per Dr.
Levar Fung.

Pathology results and recommendations below were discussed with
patient by telephone on 01/26/2021. Patient reported biopsy site
within normal limits with slight tenderness at the site, and no
significant bruising. Post biopsy care instructions were reviewed,
questions were answered and my direct phone number was provided to
patient. Patient was instructed to call [[email or web address]] if
any concerns or questions arise related to the biopsy.

Recommendation: Resume annual bilateral screening mammogram due
January 2022.

Pathology results reported by Zeyneddin Nuh RN on 01/29/2021.



Using sterile technique and 1% Lidocaine as local anesthetic, under
stereotactic guidance, a 9 gauge vacuum assisted device was used to
perform core needle biopsy of calcifications in the upper-outer
quadrant of the left breast using a superior approach. Specimen
radiograph was performed showing presence of calcifications.
Specimens with calcifications are identified for pathology.

Lesion quadrant: Upper outer quadrant

At the conclusion of the procedure, ribbon tissue marker clip was
deployed into the biopsy cavity. Follow-up 2-view mammogram was
performed and dictated separately.
IMPRESSION: Stereotactic-guided biopsy of left breast. No apparent
complications.

## 2023-03-16 DIAGNOSIS — R519 Headache, unspecified: Secondary | ICD-10-CM | POA: Diagnosis not present

## 2023-03-16 DIAGNOSIS — Z6834 Body mass index (BMI) 34.0-34.9, adult: Secondary | ICD-10-CM | POA: Diagnosis not present

## 2023-03-16 DIAGNOSIS — Z03818 Encounter for observation for suspected exposure to other biological agents ruled out: Secondary | ICD-10-CM | POA: Diagnosis not present

## 2023-03-31 DIAGNOSIS — Z111 Encounter for screening for respiratory tuberculosis: Secondary | ICD-10-CM | POA: Diagnosis not present

## 2023-04-02 DIAGNOSIS — Z0279 Encounter for issue of other medical certificate: Secondary | ICD-10-CM | POA: Diagnosis not present

## 2023-04-02 DIAGNOSIS — Z111 Encounter for screening for respiratory tuberculosis: Secondary | ICD-10-CM | POA: Diagnosis not present

## 2023-06-02 ENCOUNTER — Emergency Department
Admission: EM | Admit: 2023-06-02 | Discharge: 2023-06-02 | Disposition: A | Discharge status: Home / Self Care | Payer: 59 | Attending: Emergency Medicine | Admitting: Emergency Medicine

## 2023-06-02 ENCOUNTER — Other Ambulatory Visit: Payer: Self-pay

## 2023-06-02 DIAGNOSIS — R0789 Other chest pain: Secondary | ICD-10-CM | POA: Diagnosis not present

## 2023-06-02 DIAGNOSIS — R0602 Shortness of breath: Secondary | ICD-10-CM | POA: Diagnosis not present

## 2023-06-02 DIAGNOSIS — R079 Chest pain, unspecified: Secondary | ICD-10-CM | POA: Insufficient documentation

## 2023-06-02 LAB — CBC WITH DIFFERENTIAL/PLATELET
Abs Immature Granulocytes: 0.01 10*3/uL (ref 0.00–0.07)
Basophils Absolute: 0 10*3/uL (ref 0.0–0.1)
Basophils Relative: 1 %
Eosinophils Absolute: 0.1 10*3/uL (ref 0.0–0.5)
Eosinophils Relative: 1 %
HCT: 38 % (ref 36.0–46.0)
Hemoglobin: 12 g/dL (ref 12.0–15.0)
Immature Granulocytes: 0 %
Lymphocytes Relative: 54 %
Lymphs Abs: 4.1 10*3/uL — ABNORMAL HIGH (ref 0.7–4.0)
MCH: 27.3 pg (ref 26.0–34.0)
MCHC: 31.6 g/dL (ref 30.0–36.0)
MCV: 86.4 fL (ref 80.0–100.0)
Monocytes Absolute: 0.7 10*3/uL (ref 0.1–1.0)
Monocytes Relative: 9 %
Neutro Abs: 2.6 10*3/uL (ref 1.7–7.7)
Neutrophils Relative %: 35 %
Platelets: 330 10*3/uL (ref 150–400)
RBC: 4.4 MIL/uL (ref 3.87–5.11)
RDW: 12.6 % (ref 11.5–15.5)
WBC: 7.6 10*3/uL (ref 4.0–10.5)
nRBC: 0 % (ref 0.0–0.2)

## 2023-06-02 LAB — COMPREHENSIVE METABOLIC PANEL
ALT: 13 U/L (ref 0–44)
AST: 15 U/L (ref 15–41)
Albumin: 3.3 g/dL — ABNORMAL LOW (ref 3.5–5.0)
Alkaline Phosphatase: 70 U/L (ref 38–126)
Anion gap: 6 (ref 5–15)
BUN: 18 mg/dL (ref 6–20)
CO2: 23 mmol/L (ref 22–32)
Calcium: 8.9 mg/dL (ref 8.9–10.3)
Chloride: 109 mmol/L (ref 98–111)
Creatinine, Ser: 0.9 mg/dL (ref 0.44–1.00)
GFR, Estimated: >60 mL/min (ref >60)
Glucose, Bld: 94 mg/dL (ref 70–99)
Potassium: 3.8 mmol/L (ref 3.5–5.1)
Sodium: 138 mmol/L (ref 135–145)
Total Bilirubin: 0.3 mg/dL (ref 0.3–1.2)
Total Protein: 6.1 g/dL — ABNORMAL LOW (ref 6.5–8.1)

## 2023-06-02 LAB — TROPONIN I (HIGH SENSITIVITY)
Troponin I (High Sensitivity): <2 ng/L (ref <18)
Troponin I (High Sensitivity): 2 ng/L (ref <18)

## 2023-06-02 LAB — D-DIMER, QUANTITATIVE: D-Dimer, Quant: 0.37 ug/mL-FEU (ref 0.00–0.50)

## 2023-06-02 LAB — BRAIN NATRIURETIC PEPTIDE: B Natriuretic Peptide: 41.2 pg/mL (ref 0.0–100.0)

## 2023-06-02 NOTE — ED Provider Notes (Signed)
[facility] Provider Note    Event Date/Time   First MD Initiated Contact with Patient 06/02/23 [phone]     (approximate)   History   Chest Pain   HPI Jo Duncan is a 47 y.o. female who reportedly has a history of high cholesterol but otherwise no chronic medical issues.  She presents for evaluation of an episode of pain in her chest and back this started while she was asleep.  This happened to her once before a few weeks ago and resolved on its own.  She said that this time it is more clearly radiating into her chest.  She has not done anything to cause a musculoskeletal injury and has had no recent traumatic injury.  She said that when it happened it was accompanied with some shortness of breath but now that has resolved.  No nausea nor vomiting.  No recent fever.  No abdominal pain.  She thinks she has some family members who have had heart disease.  She does not smoke tobacco, does not have diabetes.  No recent long trips or immobilizations.     Physical Exam   Triage Vital Signs: ED Triage Vitals  Enc Vitals Group     BP 06/02/23 0529 135/87     Pulse Rate 06/02/23 0529 73     Resp 06/02/23 0529 15     Temp 06/02/23 0529 98.4 F (36.9 C)     Temp Source 06/02/23 0529 Oral     SpO2 06/02/23 0529 99 %     Weight 06/02/23 0527 79.4 kg (175 lb)     Height 06/02/23 0527 1.575 m (5\' 2" )     Head Circumference --      Peak Flow --      Pain Score 06/02/23 0527 7     Pain Loc --      Pain Edu? --      Excl. in GC? --     Most recent vital signs: Vitals:   06/02/23 0600 06/02/23 0630  BP: (!) 143/84 (!) 144/80  Pulse:  (!) 52  Resp: 13 19  Temp:    SpO2: 99% 100%    General: Awake, no distress.  Generally well-appearing. CV:  Good peripheral perfusion.  Normal heart sounds. Resp:  Normal effort. Speaking easily and comfortably, no accessory muscle usage nor intercostal retractions.  Clear to auscultation bilaterally. Abd:  No  distention.    ED Results / Procedures / Treatments   Labs (all labs ordered are listed, but only abnormal results are displayed) Labs Reviewed  CBC WITH DIFFERENTIAL/PLATELET - Abnormal; Notable for the following components:      Result Value   Lymphs Abs 4.1 (*)    All other components within normal limits  COMPREHENSIVE METABOLIC PANEL - Abnormal; Notable for the following components:   Total Protein 6.1 (*)    Albumin 3.3 (*)    All other components within normal limits  BRAIN NATRIURETIC PEPTIDE  D-DIMER, QUANTITATIVE  TROPONIN I (HIGH SENSITIVITY)  TROPONIN I (HIGH SENSITIVITY)     EKG  ED ECG REPORT I, Loleta Rose, the attending physician, personally viewed and interpreted this ECG.  Date: 06/02/2023 EKG Time: 5:30 AM Rate: 72 Rhythm: normal sinus rhythm QRS Axis: normal Intervals: normal ST/T Wave abnormalities: Non-specific ST segment / T-wave changes, but no clear evidence of acute ischemia. Narrative Interpretation: no definitive evidence of acute ischemia; does not meet STEMI criteria.    PROCEDURES:  Critical Care performed: No  .  1-3 Lead EKG Interpretation  Performed by: Loleta Rose, MD Authorized by: Loleta Rose, MD     Interpretation: abnormal     ECG rate:  55   ECG rate assessment: bradycardic     Rhythm: sinus bradycardia     Ectopy: none     Conduction: normal       IMPRESSION / MDM / ASSESSMENT AND PLAN / ED COURSE  I reviewed the triage vital signs and the nursing notes.                              Differential diagnosis includes, but is not limited to, angina, ACS including unstable angina, PE, pneumonia, pneumothorax, costochondritis, musculoskeletal strain, GI associated chest pain.  Patient's presentation is most consistent with acute presentation with potential threat to life or bodily function.  Labs/studies ordered: BNP, high-sensitivity troponin x 2, CMP, D-dimer, CBC with differential  Interventions/Medications  given:  Medications - No data to display  (Note:  hospital course my include additional interventions and/or labs/studies not listed above.)   Low risk for ACS based on HEAR score, PERC negative, Wells score for PE is 0, D-dimer within normal limits.  No evidence of ischemia on EKG, normal initial troponin, labs otherwise unremarkable.  The patient is on the cardiac monitor to evaluate for evidence of arrhythmia and/or significant heart rate changes.  I talked with the patient and explained that I think it is unlikely she has a emergent medical condition.  She agrees with the plan for discharge if her second troponin is within normal limits.  She agreed with the plan for referral to cardiology.  I am transferring ED care to Dr. Renaldo Reel to follow-up on second troponin and disposition the patient appropriately.      FINAL CLINICAL IMPRESSION(S) / ED DIAGNOSES   Final diagnoses:  Chest pain, unspecified type     Rx / DC Orders   ED Discharge Orders          Ordered    Ambulatory referral to Cardiology       Comments: If you have not heard from the Cardiology office within the next 72 hours please call [phone].   06/02/23 5621             Note:  This document was prepared using Dragon voice recognition software and may include unintentional dictation errors.   Loleta Rose, MD 06/02/23 [phone]

## 2023-06-02 NOTE — ED Notes (Signed)
ED Provider at bedside. 

## 2023-06-02 NOTE — ED Triage Notes (Signed)
Pt states she has back pain that is radiating around into her chest, started last night and this is the second episode of pain in a month. Pt denies any known injury or lifting anything heavy. Pt states she also has some shortness of breath.

## 2023-06-02 NOTE — Discharge Instructions (Signed)

## 2023-07-24 DIAGNOSIS — R0981 Nasal congestion: Secondary | ICD-10-CM | POA: Diagnosis not present

## 2023-07-24 DIAGNOSIS — Z6834 Body mass index (BMI) 34.0-34.9, adult: Secondary | ICD-10-CM | POA: Diagnosis not present

## 2023-07-24 DIAGNOSIS — R519 Headache, unspecified: Secondary | ICD-10-CM | POA: Diagnosis not present

## 2023-08-28 DIAGNOSIS — N898 Other specified noninflammatory disorders of vagina: Secondary | ICD-10-CM | POA: Diagnosis not present

## 2023-08-28 DIAGNOSIS — R7303 Prediabetes: Secondary | ICD-10-CM | POA: Diagnosis not present

## 2023-08-28 DIAGNOSIS — E782 Mixed hyperlipidemia: Secondary | ICD-10-CM | POA: Diagnosis not present

## 2023-10-09 ENCOUNTER — Other Ambulatory Visit: Payer: Self-pay

## 2023-10-09 ENCOUNTER — Emergency Department: Payer: No Typology Code available for payment source

## 2023-10-09 ENCOUNTER — Emergency Department
Admission: EM | Admit: 2023-10-09 | Discharge: 2023-10-09 | Disposition: A | Discharge status: Home / Self Care | Payer: No Typology Code available for payment source | Attending: Emergency Medicine | Admitting: Emergency Medicine

## 2023-10-09 DIAGNOSIS — U071 COVID-19: Secondary | ICD-10-CM | POA: Diagnosis not present

## 2023-10-09 DIAGNOSIS — R059 Cough, unspecified: Secondary | ICD-10-CM | POA: Diagnosis present

## 2023-10-09 DIAGNOSIS — R0602 Shortness of breath: Secondary | ICD-10-CM

## 2023-10-09 LAB — COMPREHENSIVE METABOLIC PANEL
ALT: 21 U/L (ref 0–44)
AST: 23 U/L (ref 15–41)
Albumin: 3.6 g/dL (ref 3.5–5.0)
Alkaline Phosphatase: 59 U/L (ref 38–126)
Anion gap: 8 (ref 5–15)
BUN: 12 mg/dL (ref 6–20)
CO2: 22 mmol/L (ref 22–32)
Calcium: 8.9 mg/dL (ref 8.9–10.3)
Chloride: 106 mmol/L (ref 98–111)
Creatinine, Ser: 0.8 mg/dL (ref 0.44–1.00)
GFR, Estimated: >60 mL/min (ref >60)
Glucose, Bld: 92 mg/dL (ref 70–99)
Potassium: 3.7 mmol/L (ref 3.5–5.1)
Sodium: 136 mmol/L (ref 135–145)
Total Bilirubin: 0.3 mg/dL (ref <1.2)
Total Protein: 7 g/dL (ref 6.5–8.1)

## 2023-10-09 LAB — CBC WITH DIFFERENTIAL/PLATELET
Abs Immature Granulocytes: 0.01 10*3/uL (ref 0.00–0.07)
Basophils Absolute: 0 10*3/uL (ref 0.0–0.1)
Basophils Relative: 0 %
Eosinophils Absolute: 0.1 10*3/uL (ref 0.0–0.5)
Eosinophils Relative: 1 %
HCT: 40.4 % (ref 36.0–46.0)
Hemoglobin: 12.9 g/dL (ref 12.0–15.0)
Immature Granulocytes: 0 %
Lymphocytes Relative: 8 %
Lymphs Abs: 0.6 10*3/uL — ABNORMAL LOW (ref 0.7–4.0)
MCH: 27.3 pg (ref 26.0–34.0)
MCHC: 31.9 g/dL (ref 30.0–36.0)
MCV: 85.4 fL (ref 80.0–100.0)
Monocytes Absolute: 0.9 10*3/uL (ref 0.1–1.0)
Monocytes Relative: 12 %
Neutro Abs: 5.8 10*3/uL (ref 1.7–7.7)
Neutrophils Relative %: 79 %
Platelets: 322 10*3/uL (ref 150–400)
RBC: 4.73 MIL/uL (ref 3.87–5.11)
RDW: 13 % (ref 11.5–15.5)
WBC: 7.3 10*3/uL (ref 4.0–10.5)
nRBC: 0 % (ref 0.0–0.2)

## 2023-10-09 LAB — RESP PANEL BY RT-PCR (RSV, FLU A&B, COVID)  RVPGX2
Influenza A by PCR: NEGATIVE
Influenza B by PCR: NEGATIVE
Resp Syncytial Virus by PCR: NEGATIVE
SARS Coronavirus 2 by RT PCR: POSITIVE — AB

## 2023-10-09 LAB — TROPONIN I (HIGH SENSITIVITY): Troponin I (High Sensitivity): 3 ng/L (ref <18)

## 2023-10-09 LAB — D-DIMER, QUANTITATIVE: D-Dimer, Quant: 0.35 ug{FEU}/mL (ref 0.00–0.50)

## 2023-10-09 MED ORDER — ACETAMINOPHEN 325 MG PO TABS
650.0000 mg | ORAL_TABLET | Freq: Once | ORAL | Status: AC
  Administered 2023-10-09: 650 mg via ORAL
  Filled 2023-10-09: qty 2

## 2023-10-09 MED ORDER — ONDANSETRON 4 MG PO TBDP: 4.0000 mg | ORAL_TABLET | Freq: Three times a day (TID) | ORAL | 0 refills | Status: AC | PRN

## 2023-10-09 NOTE — ED Triage Notes (Signed)
Pt reports cough congestion x2 days, worse this morning.

## 2023-10-09 NOTE — ED Provider Notes (Signed)
[facility] Provider Note    Event Date/Time   First MD Initiated Contact with Patient 10/09/23 0542     (approximate)   History   Cough   HPI  Jo Duncan is a 47 year old female presenting to the emergency department for evaluation of cough and shortness of breath.  Patient reports cough and congestion for the past few days.  Last night when she went to bed she had worsening shortness of breath as well as some chest pain when coughing leading her to present to the ER.  Denies history of similar.  No known history of heart or lung problems.  Denies nausea or vomiting, but does report some decreased p.o. intake.     Physical Exam   Triage Vital Signs: ED Triage Vitals  Encounter Vitals Group     BP 10/09/23 0540 (!) 137/98     Systolic BP Percentile --      Diastolic BP Percentile --      Pulse Rate 10/09/23 0540 (!) 122     Resp 10/09/23 0540 18     Temp 10/09/23 0540 99.3 F (37.4 C)     Temp Source 10/09/23 0540 Oral     SpO2 10/09/23 0540 97 %     Weight 10/09/23 0540 176 lb (79.8 kg)     Height 10/09/23 0540 4\' 11"  (1.499 m)     Head Circumference --      Peak Flow --      Pain Score 10/09/23 0539 6     Pain Loc --      Pain Education --      Exclude from Growth Chart --     Most recent vital signs: Vitals:   10/09/23 0650 10/09/23 0654  BP: 132/76   Pulse: (!) 102   Resp: (!) 21   Temp:  100.3 F (37.9 C)  SpO2: 100%      General: Awake, interactive  CV:  Tachycardic with heart rates in the 120s with regular rhythm, normal peripheral perfusion Resp:  Lung sounds mildly coarse bilaterally, respirations not significantly labored Abd:  Soft, nontender, nondistended  Neuro:  Symmetric facial movement, fluid speech   ED Results / Procedures / Treatments   Labs (all labs ordered are listed, but only abnormal results are displayed) Labs Reviewed  RESP PANEL BY RT-PCR (RSV, FLU A&B, COVID)  RVPGX2 - Abnormal; Notable  for the following components:      Result Value   SARS Coronavirus 2 by RT PCR POSITIVE (*)    All other components within normal limits  CBC WITH DIFFERENTIAL/PLATELET - Abnormal; Notable for the following components:   Lymphs Abs 0.6 (*)    All other components within normal limits  COMPREHENSIVE METABOLIC PANEL  D-DIMER, QUANTITATIVE  TROPONIN I (HIGH SENSITIVITY)     EKG EKG independently reviewed interpreted by myself (ER attending) demonstrates:  EKG demonstrates sinus tachycardia at a rate of 104, PR 144, cures 74, QTc 425, no acute ST changes  RADIOLOGY Imaging independently reviewed and interpreted by myself demonstrates:  Chest x-Larnell Granlund without obvious focal consolidation, radiology notes some asymmetric perihilar opacities  PROCEDURES:  Critical Care performed: No  Procedures   MEDICATIONS ORDERED IN ED: Medications  acetaminophen (TYLENOL) tablet 650 mg (has no administration in time range)     IMPRESSION / MDM / ASSESSMENT AND PLAN / ED COURSE  I reviewed the triage vital signs and the nursing notes.  Differential diagnosis includes, but is not limited to, viral  illness, pneumonia, PE, ACS  Patient's presentation is most consistent with acute presentation with potential threat to life or bodily function.  47 year old female presenting to the emergency department for evaluation of shortness of breath and chest pain in the setting of viral symptoms.  Vitals on presentation notable for significant tachycardia.  Is tolerating p.o., oral rehydration ordered in the setting of IV fluid shortage.  With her tachycardia and shortness of breath, will obtain labs to further evaluate.  COVID swab did return positive.  Repeat vitals with borderline fever.  Tylenol ordered.  Signed out to oncoming physician pending labs, reevaluation, disposition.      FINAL CLINICAL IMPRESSION(S) / ED DIAGNOSES   Final diagnoses:  Shortness of breath  COVID-19     Rx / DC Orders    ED Discharge Orders     None        Note:  This document was prepared using Dragon voice recognition software and may include unintentional dictation errors.   Trinna Post, MD 10/09/23 [phone]

## 2023-10-13 ENCOUNTER — Other Ambulatory Visit: Payer: Self-pay

## 2023-10-13 ENCOUNTER — Encounter: Payer: Self-pay | Admitting: Emergency Medicine

## 2023-10-13 ENCOUNTER — Emergency Department
Admission: EM | Admit: 2023-10-13 | Discharge: 2023-10-13 | Disposition: A | Discharge status: Home / Self Care | Payer: No Typology Code available for payment source | Attending: Emergency Medicine | Admitting: Emergency Medicine

## 2023-10-13 DIAGNOSIS — R42 Dizziness and giddiness: Secondary | ICD-10-CM | POA: Insufficient documentation

## 2023-10-13 LAB — CBC
HCT: 43.3 % (ref 36.0–46.0)
Hemoglobin: 13.9 g/dL (ref 12.0–15.0)
MCH: 27.4 pg (ref 26.0–34.0)
MCHC: 32.1 g/dL (ref 30.0–36.0)
MCV: 85.4 fL (ref 80.0–100.0)
Platelets: 347 10*3/uL (ref 150–400)
RBC: 5.07 MIL/uL (ref 3.87–5.11)
RDW: 12.9 % (ref 11.5–15.5)
WBC: 7 10*3/uL (ref 4.0–10.5)
nRBC: 0 % (ref 0.0–0.2)

## 2023-10-13 LAB — BASIC METABOLIC PANEL
Anion gap: 7 (ref 5–15)
BUN: 16 mg/dL (ref 6–20)
CO2: 26 mmol/L (ref 22–32)
Calcium: 9.2 mg/dL (ref 8.9–10.3)
Chloride: 106 mmol/L (ref 98–111)
Creatinine, Ser: 0.89 mg/dL (ref 0.44–1.00)
GFR, Estimated: >60 mL/min (ref >60)
Glucose, Bld: 94 mg/dL (ref 70–99)
Potassium: 3.7 mmol/L (ref 3.5–5.1)
Sodium: 139 mmol/L (ref 135–145)

## 2023-10-13 LAB — CBG MONITORING, ED: Glucose-Capillary: 85 mg/dL (ref 70–99)

## 2023-10-13 NOTE — ED Provider Notes (Signed)
[facility] Provider Note    Event Date/Time   First MD Initiated Contact with Patient 10/13/23 1513     (approximate)   History   Dizziness   HPI  Jo Duncan is a 47 y.o. female who presents to the emergency department with dizziness.  States that she was diagnosed with COVID this past Thursday.  She has been quarantining and then today she got up to go to [place].  States that she had an episode where she was feeling dizzy with some nausea.  Some blurry vision.  States that since arriving to the emergency department she is feeling much better.  Denies any blurry vision or double vision at this time.  Denies any room spinning dizziness.  Denies any chest pain.  States that she had some mild shortness of breath whenever she was cleaning things this morning but denies any shortness of breath at rest now.  No trouble with her gait.  No headache.     Physical Exam   Triage Vital Signs: ED Triage Vitals  Encounter Vitals Group     BP 10/13/23 1148 (!) 138/98     Systolic BP Percentile --      Diastolic BP Percentile --      Pulse Rate 10/13/23 1148 90     Resp 10/13/23 1148 18     Temp 10/13/23 1148 98.3 F (36.8 C)     Temp Source 10/13/23 1148 Oral     SpO2 10/13/23 1148 96 %     Weight 10/13/23 1153 176 lb (79.8 kg)     Height 10/13/23 1153 4\' 11"  (1.499 m)     Head Circumference --      Peak Flow --      Pain Score 10/13/23 1153 0     Pain Loc --      Pain Education --      Exclude from Growth Chart --     Most recent vital signs: Vitals:   10/13/23 1148  BP: (!) 138/98  Pulse: 90  Resp: 18  Temp: 98.3 F (36.8 C)  SpO2: 96%    Physical Exam Constitutional:      Appearance: She is well-developed.  HENT:     Head: Atraumatic.     Right Ear: Tympanic membrane normal.     Left Ear: Tympanic membrane normal.  Eyes:     Extraocular Movements: Extraocular movements intact.     Conjunctiva/sclera: Conjunctivae normal.      Pupils: Pupils are equal, round, and reactive to light.     Comments: No nystagmus  Cardiovascular:     Rate and Rhythm: Regular rhythm.     Heart sounds: No murmur heard. Pulmonary:     Effort: No respiratory distress.  Abdominal:     General: There is no distension.     Tenderness: There is no abdominal tenderness.  Musculoskeletal:        General: No swelling. Normal range of motion.     Cervical back: Normal range of motion.  Skin:    General: Skin is warm.     Capillary Refill: Capillary refill takes less than 2 seconds.  Neurological:     Mental Status: She is alert. Mental status is at baseline.     GCS: GCS eye subscore is 4. GCS verbal subscore is 5. GCS motor subscore is 6.     Cranial Nerves: Cranial nerves 2-12 are intact.     Sensory: Sensation is intact.  Motor: Motor function is intact.     Coordination: Coordination is intact.     Gait: Gait is intact.     IMPRESSION / MDM / ASSESSMENT AND PLAN / ED COURSE  I reviewed the triage vital signs and the nursing notes.  Differential diagnosis including peripheral vertigo, dehydration, electrolyte abnormality, central vertigo  EKG  I, Corena Herter, the attending physician, personally viewed and interpreted this ECG.   Rate: Normal  Rhythm: Normal sinus  Axis: Normal  Intervals: Normal  ST&T Change: None  LABS (all labs ordered are listed, but only abnormal results are displayed) Labs interpreted as -    Labs Reviewed  BASIC METABOLIC PANEL  CBC  CBG MONITORING, ED     MDM  Patient is reassuring with no significant electrolyte abnormalities.  The patient without a neurologic deficit at this time, have a low suspicion for posterior circulation CVA or dissection.  No change in vision or double vision.  No nystagmus on exam.  Normal ambulation.  No coordination issues.  Denies chest pain, have a low suspicion for ACS.  No symptoms of a urinary tract infection.  Denies any concern for pregnancy.  No  symptoms at this time and states that she is feeling better.  Clinical picture is most concerning for peripheral vertigo versus dehydration.  Blood pressure elevated in the emergency department.  Discussed follow-up with her primary care physician and blood pressure recheck.     PROCEDURES:  Critical Care performed: No  Procedures  Patient's presentation is most consistent with acute presentation with potential threat to life or bodily function.   MEDICATIONS ORDERED IN ED: Medications - No data to display  FINAL CLINICAL IMPRESSION(S) / ED DIAGNOSES   Final diagnoses:  Dizziness     Rx / DC Orders   ED Discharge Orders     None        Note:  This document was prepared using Dragon voice recognition software and may include unintentional dictation errors.   Corena Herter, MD 10/13/23 1610

## 2023-10-13 NOTE — ED Notes (Signed)
Pt denies any dizziness or blurred vision at time of assessment.   Pt states she just voided and is unable to provide a urine specimen at this time.

## 2023-10-13 NOTE — ED Triage Notes (Signed)
Patient to ED via POV for dizziness. Pt states she tested positive for COVID on Thursday. Was told to come in to be seen by triage RN that called to check on pt. Hx of vertigo

## 2023-11-06 ENCOUNTER — Inpatient Hospital Stay: Admission: RE | Admit: 2023-11-06 | Payer: Self-pay | Source: Ambulatory Visit

## 2024-01-02 ENCOUNTER — Emergency Department
Admission: EM | Admit: 2024-01-02 | Discharge: 2024-01-02 | Disposition: A | Discharge status: Home / Self Care | Payer: No Typology Code available for payment source | Attending: Student in an Organized Health Care Education/Training Program | Admitting: Student in an Organized Health Care Education/Training Program

## 2024-01-02 ENCOUNTER — Encounter: Payer: Self-pay | Admitting: Emergency Medicine

## 2024-01-02 ENCOUNTER — Other Ambulatory Visit: Payer: Self-pay

## 2024-01-02 DIAGNOSIS — J101 Influenza due to other identified influenza virus with other respiratory manifestations: Secondary | ICD-10-CM | POA: Diagnosis not present

## 2024-01-02 DIAGNOSIS — R059 Cough, unspecified: Secondary | ICD-10-CM | POA: Diagnosis present

## 2024-01-02 DIAGNOSIS — Z20822 Contact with and (suspected) exposure to covid-19: Secondary | ICD-10-CM | POA: Diagnosis not present

## 2024-01-02 LAB — URINALYSIS, ROUTINE W REFLEX MICROSCOPIC
Bilirubin Urine: NEGATIVE
Glucose, UA: NEGATIVE mg/dL
Hgb urine dipstick: NEGATIVE
Ketones, ur: 20 mg/dL — AB
Leukocytes,Ua: NEGATIVE
Nitrite: NEGATIVE
Protein, ur: 30 mg/dL — AB
Specific Gravity, Urine: 1.03 (ref 1.005–1.030)
pH: 5 (ref 5.0–8.0)

## 2024-01-02 LAB — CBC WITH DIFFERENTIAL/PLATELET
Abs Immature Granulocytes: 0.03 10*3/uL (ref 0.00–0.07)
Basophils Absolute: 0 10*3/uL (ref 0.0–0.1)
Basophils Relative: 1 %
Eosinophils Absolute: 0 10*3/uL (ref 0.0–0.5)
Eosinophils Relative: 0 %
HCT: 38.5 % (ref 36.0–46.0)
Hemoglobin: 12.8 g/dL (ref 12.0–15.0)
Immature Granulocytes: 0 %
Lymphocytes Relative: 7 %
Lymphs Abs: 0.6 10*3/uL — ABNORMAL LOW (ref 0.7–4.0)
MCH: 27.5 pg (ref 26.0–34.0)
MCHC: 33.2 g/dL (ref 30.0–36.0)
MCV: 82.8 fL (ref 80.0–100.0)
Monocytes Absolute: 0.9 10*3/uL (ref 0.1–1.0)
Monocytes Relative: 12 %
Neutro Abs: 6.5 10*3/uL (ref 1.7–7.7)
Neutrophils Relative %: 80 %
Platelets: 343 10*3/uL (ref 150–400)
RBC: 4.65 MIL/uL (ref 3.87–5.11)
RDW: 13.1 % (ref 11.5–15.5)
WBC: 8.1 10*3/uL (ref 4.0–10.5)
nRBC: 0 % (ref 0.0–0.2)

## 2024-01-02 LAB — COMPREHENSIVE METABOLIC PANEL
ALT: 20 U/L (ref 0–44)
AST: 21 U/L (ref 15–41)
Albumin: 3.8 g/dL (ref 3.5–5.0)
Alkaline Phosphatase: 68 U/L (ref 38–126)
Anion gap: 12 (ref 5–15)
BUN: 12 mg/dL (ref 6–20)
CO2: 20 mmol/L — ABNORMAL LOW (ref 22–32)
Calcium: 9.2 mg/dL (ref 8.9–10.3)
Chloride: 109 mmol/L (ref 98–111)
Creatinine, Ser: 0.81 mg/dL (ref 0.44–1.00)
GFR, Estimated: >60 mL/min (ref >60)
Glucose, Bld: 117 mg/dL — ABNORMAL HIGH (ref 70–99)
Potassium: 3.7 mmol/L (ref 3.5–5.1)
Sodium: 141 mmol/L (ref 135–145)
Total Bilirubin: 0.7 mg/dL (ref 0.0–1.2)
Total Protein: 7.3 g/dL (ref 6.5–8.1)

## 2024-01-02 LAB — LIPASE, BLOOD: Lipase: 24 U/L (ref 11–51)

## 2024-01-02 LAB — RESP PANEL BY RT-PCR (RSV, FLU A&B, COVID)  RVPGX2
Influenza A by PCR: POSITIVE — AB
Influenza B by PCR: NEGATIVE
Resp Syncytial Virus by PCR: NEGATIVE
SARS Coronavirus 2 by RT PCR: NEGATIVE

## 2024-01-02 MED ORDER — ONDANSETRON 4 MG PO TBDP
4.0000 mg | ORAL_TABLET | Freq: Once | ORAL | Status: AC
  Administered 2024-01-02: 4 mg via ORAL
  Filled 2024-01-02: qty 1

## 2024-01-02 MED ORDER — ONDANSETRON 4 MG PO TBDP: 4.0000 mg | ORAL_TABLET | Freq: Three times a day (TID) | ORAL | 0 refills | Status: AC | PRN

## 2024-01-02 MED ORDER — OSELTAMIVIR PHOSPHATE 75 MG PO CAPS: 75.0000 mg | ORAL_CAPSULE | Freq: Two times a day (BID) | ORAL | 0 refills | Status: AC

## 2024-01-02 MED ORDER — METOCLOPRAMIDE HCL 5 MG/ML IJ SOLN
10.0000 mg | Freq: Once | INTRAMUSCULAR | Status: AC
  Administered 2024-01-02: 10 mg via INTRAVENOUS
  Filled 2024-01-02: qty 2

## 2024-01-02 MED ORDER — IBUPROFEN 600 MG PO TABS
600.0000 mg | ORAL_TABLET | Freq: Once | ORAL | Status: AC
  Administered 2024-01-02: 600 mg via ORAL
  Filled 2024-01-02: qty 1

## 2024-01-02 MED ORDER — SODIUM CHLORIDE 0.9 % IV BOLUS
1000.0000 mL | Freq: Once | INTRAVENOUS | Status: AC
  Administered 2024-01-02: 1000 mL via INTRAVENOUS

## 2024-01-02 NOTE — ED Notes (Signed)
Emesis X 1 after Zofran. EDP informed.

## 2024-01-02 NOTE — ED Provider Notes (Signed)
[facility] Provider Note    Event Date/Time   First MD Initiated Contact with Patient 01/02/24 [phone]     (approximate)   History   Emesis   HPI  Jo Duncan is a 48 y.o. female presents to the ER for evaluation of 24 to 48 hours of myalgias congestion cough sore throat nausea vomiting.  Patient went to [place] ED yesterday was told that her flu test was negative.  Had persistent symptoms including nausea and vomiting.  Did not get her flu shot this year.     Physical Exam   Triage Vital Signs: ED Triage Vitals  Encounter Vitals Group     BP 01/02/24 0444 124/81     Systolic BP Percentile --      Diastolic BP Percentile --      Pulse Rate 01/02/24 0444 (!) 115     Resp 01/02/24 0444 18     Temp 01/02/24 0444 99.8 F (37.7 C)     Temp Source 01/02/24 0444 Oral     SpO2 01/02/24 0444 97 %     Weight 01/02/24 0443 170 lb (77.1 kg)     Height 01/02/24 0443 5' (1.524 m)     Head Circumference --      Peak Flow --      Pain Score 01/02/24 0443 10     Pain Loc --      Pain Education --      Exclude from Growth Chart --     Most recent vital signs: Vitals:   01/02/24 0444  BP: 124/81  Pulse: (!) 115  Resp: 18  Temp: 99.8 F (37.7 C)  SpO2: 97%     Constitutional: Alert  Eyes: Conjunctivae are normal.  Head: Atraumatic. Nose: No congestion/rhinnorhea. Mouth/Throat: Mucous membranes are moist.   Neck: Painless ROM.  Cardiovascular:   Good peripheral circulation. Respiratory: Normal respiratory effort.  No retractions.  Gastrointestinal: Soft and nontender.  Musculoskeletal:  no deformity Neurologic:  MAE spontaneously. No gross focal neurologic deficits are appreciated.  Skin:  Skin is warm, dry and intact. No rash noted. Psychiatric: Mood and affect are normal. Speech and behavior are normal.    ED Results / Procedures / Treatments   Labs (all labs ordered are listed, but only abnormal results are displayed) Labs  Reviewed  RESP PANEL BY RT-PCR (RSV, FLU A&B, COVID)  RVPGX2 - Abnormal; Notable for the following components:      Result Value   Influenza A by PCR POSITIVE (*)    All other components within normal limits  CBC WITH DIFFERENTIAL/PLATELET - Abnormal; Notable for the following components:   Lymphs Abs 0.6 (*)    All other components within normal limits  COMPREHENSIVE METABOLIC PANEL - Abnormal; Notable for the following components:   CO2 20 (*)    Glucose, Bld 117 (*)    All other components within normal limits  URINALYSIS, ROUTINE W REFLEX MICROSCOPIC - Abnormal; Notable for the following components:   Color, Urine YELLOW (*)    APPearance TURBID (*)    Ketones, ur 20 (*)    Protein, ur 30 (*)    Bacteria, UA MANY (*)    All other components within normal limits  LIPASE, BLOOD     EKG     RADIOLOGY    PROCEDURES:  Critical Care performed:   Procedures   MEDICATIONS ORDERED IN ED: Medications  ondansetron (ZOFRAN-ODT) disintegrating tablet 4 mg (has no administration in time range)  ibuprofen (ADVIL) tablet 600 mg (has no administration in time range)     IMPRESSION / MDM / ASSESSMENT AND PLAN / ED COURSE  I reviewed the triage vital signs and the nursing notes.                              Differential diagnosis includes, but is not limited to, influenza, pneumonia, sepsis, dehydration, electrolyte abnormality  Patient presenting to the ER for evaluation of symptoms as described above.  Based on symptoms, risk factors and considered above differential, this presenting complaint could reflect a potentially life-threatening illness therefore the patient will be placed on continuous pulse oximetry and telemetry for monitoring.  Laboratory evaluation will be sent to evaluate for the above complaints.  Blood work is reassuring.  No significant AKI.  Patient well-perfused.  Will give Motrin as well as Zofran.  Patient at low risk for significant complications  secondary to COVID.  We discussed option for Tamiflu or antiviral therapy given side effect profile will defer.  Patient does appear stable and appropriate for outpatient follow-up.       FINAL CLINICAL IMPRESSION(S) / ED DIAGNOSES   Final diagnoses:  Influenza A     Rx / DC Orders   ED Discharge Orders          Ordered    ondansetron (ZOFRAN-ODT) 4 MG disintegrating tablet  Every 8 hours PRN        01/02/24 0758             Note:  This document was prepared using Dragon voice recognition software and may include unintentional dictation errors.    Willy Eddy, MD 01/02/24 [phone]

## 2024-01-02 NOTE — ED Triage Notes (Signed)
Patient ambulatory to triage with steady gait, without difficulty or distress noted; pt reports since yesterday having HA, vomiting, nonprod cough; st seen at [facility] ED yesterday and covid swab was neg; dx with URI

## 2024-01-02 NOTE — ED Notes (Signed)
Patient states she is able to tolerate sips at this time. Patient is resting in a darkened room.

## 2024-02-19 DIAGNOSIS — R7303 Prediabetes: Secondary | ICD-10-CM | POA: Diagnosis not present

## 2024-02-19 DIAGNOSIS — Z0000 Encounter for general adult medical examination without abnormal findings: Secondary | ICD-10-CM | POA: Diagnosis not present

## 2024-02-19 DIAGNOSIS — E782 Mixed hyperlipidemia: Secondary | ICD-10-CM | POA: Diagnosis not present

## 2024-02-26 ENCOUNTER — Other Ambulatory Visit: Payer: Self-pay | Admitting: Family Medicine

## 2024-02-26 DIAGNOSIS — E782 Mixed hyperlipidemia: Secondary | ICD-10-CM | POA: Diagnosis not present

## 2024-02-26 DIAGNOSIS — Z1211 Encounter for screening for malignant neoplasm of colon: Secondary | ICD-10-CM | POA: Diagnosis not present

## 2024-02-26 DIAGNOSIS — Z1231 Encounter for screening mammogram for malignant neoplasm of breast: Secondary | ICD-10-CM | POA: Diagnosis not present

## 2024-02-26 DIAGNOSIS — Z0000 Encounter for general adult medical examination without abnormal findings: Secondary | ICD-10-CM | POA: Diagnosis not present

## 2024-02-26 DIAGNOSIS — R7303 Prediabetes: Secondary | ICD-10-CM | POA: Diagnosis not present

## 2024-03-04 DIAGNOSIS — E785 Hyperlipidemia, unspecified: Secondary | ICD-10-CM | POA: Diagnosis not present

## 2024-03-04 DIAGNOSIS — Z87891 Personal history of nicotine dependence: Secondary | ICD-10-CM | POA: Diagnosis not present

## 2024-03-04 DIAGNOSIS — Z833 Family history of diabetes mellitus: Secondary | ICD-10-CM | POA: Diagnosis not present

## 2024-03-04 DIAGNOSIS — Z809 Family history of malignant neoplasm, unspecified: Secondary | ICD-10-CM | POA: Diagnosis not present

## 2024-03-04 DIAGNOSIS — Z6836 Body mass index (BMI) 36.0-36.9, adult: Secondary | ICD-10-CM | POA: Diagnosis not present

## 2024-03-04 DIAGNOSIS — R030 Elevated blood-pressure reading, without diagnosis of hypertension: Secondary | ICD-10-CM | POA: Diagnosis not present

## 2024-03-04 DIAGNOSIS — Z823 Family history of stroke: Secondary | ICD-10-CM | POA: Diagnosis not present

## 2024-03-04 DIAGNOSIS — R7303 Prediabetes: Secondary | ICD-10-CM | POA: Diagnosis not present

## 2024-03-04 DIAGNOSIS — E669 Obesity, unspecified: Secondary | ICD-10-CM | POA: Diagnosis not present

## 2024-03-04 DIAGNOSIS — Z8249 Family history of ischemic heart disease and other diseases of the circulatory system: Secondary | ICD-10-CM | POA: Diagnosis not present

## 2024-03-04 DIAGNOSIS — Z818 Family history of other mental and behavioral disorders: Secondary | ICD-10-CM | POA: Diagnosis not present

## 2024-03-26 ENCOUNTER — Encounter

## 2024-03-30 DIAGNOSIS — Z111 Encounter for screening for respiratory tuberculosis: Secondary | ICD-10-CM | POA: Diagnosis not present

## 2024-04-01 DIAGNOSIS — Z681 Body mass index (BMI) 19 or less, adult: Secondary | ICD-10-CM | POA: Diagnosis not present

## 2024-04-01 DIAGNOSIS — Z111 Encounter for screening for respiratory tuberculosis: Secondary | ICD-10-CM | POA: Diagnosis not present

## 2024-04-02 ENCOUNTER — Ambulatory Visit
Admission: RE | Admit: 2024-04-02 | Discharge: 2024-04-02 | Disposition: A | Discharge status: Home / Self Care | Payer: Self-pay | Source: Ambulatory Visit | Attending: Family Medicine | Admitting: Family Medicine

## 2024-04-02 DIAGNOSIS — Z1231 Encounter for screening mammogram for malignant neoplasm of breast: Secondary | ICD-10-CM | POA: Diagnosis not present

## 2024-09-07 DIAGNOSIS — Z0000 Encounter for general adult medical examination without abnormal findings: Secondary | ICD-10-CM | POA: Diagnosis not present

## 2024-09-07 DIAGNOSIS — R7303 Prediabetes: Secondary | ICD-10-CM | POA: Diagnosis not present

## 2024-09-07 DIAGNOSIS — E782 Mixed hyperlipidemia: Secondary | ICD-10-CM | POA: Diagnosis not present
